# Patient Record
Sex: Male | Born: 1980 | Hispanic: No | Marital: Single | Smoking: Former smoker
Health system: Southern US, Community
[De-identification: ages and names within clinical notes are randomized; demographics above are authoritative.]

## PROBLEM LIST (undated history)

## (undated) DIAGNOSIS — K219 Gastro-esophageal reflux disease without esophagitis: Secondary | ICD-10-CM

---

## 2005-01-19 ENCOUNTER — Emergency Department (HOSPITAL_COMMUNITY): Admission: EM | Admit: 2005-01-19 | Discharge: 2005-01-19 | Payer: Self-pay | Admitting: Emergency Medicine

## 2005-01-22 ENCOUNTER — Inpatient Hospital Stay (HOSPITAL_COMMUNITY): Admission: RE | Admit: 2005-01-22 | Discharge: 2005-01-22 | Payer: Self-pay | Admitting: Otolaryngology

## 2006-03-11 ENCOUNTER — Emergency Department (HOSPITAL_COMMUNITY): Admission: EM | Admit: 2006-03-11 | Discharge: 2006-03-11 | Payer: Self-pay | Admitting: Family Medicine

## 2006-03-25 ENCOUNTER — Emergency Department (HOSPITAL_COMMUNITY): Admission: EM | Admit: 2006-03-25 | Discharge: 2006-03-25 | Payer: Self-pay | Admitting: Family Medicine

## 2006-03-27 ENCOUNTER — Emergency Department (HOSPITAL_COMMUNITY): Admission: EM | Admit: 2006-03-27 | Discharge: 2006-03-27 | Payer: Self-pay | Admitting: Emergency Medicine

## 2006-04-02 ENCOUNTER — Emergency Department (HOSPITAL_COMMUNITY): Admission: EM | Admit: 2006-04-02 | Discharge: 2006-04-02 | Payer: Self-pay | Admitting: Emergency Medicine

## 2006-04-09 ENCOUNTER — Emergency Department (HOSPITAL_COMMUNITY): Admission: EM | Admit: 2006-04-09 | Discharge: 2006-04-09 | Payer: Self-pay | Admitting: Emergency Medicine

## 2013-06-21 ENCOUNTER — Ambulatory Visit: Payer: Self-pay | Admitting: Family Medicine

## 2013-06-21 VITALS — BP 112/66 | HR 60 | Temp 98.3°F | Resp 16 | Ht 64.5 in | Wt 130.0 lb

## 2013-06-21 DIAGNOSIS — R1013 Epigastric pain: Secondary | ICD-10-CM

## 2013-06-21 DIAGNOSIS — IMO0001 Reserved for inherently not codable concepts without codable children: Secondary | ICD-10-CM

## 2013-06-21 DIAGNOSIS — G8929 Other chronic pain: Secondary | ICD-10-CM

## 2013-06-21 DIAGNOSIS — R6889 Other general symptoms and signs: Secondary | ICD-10-CM

## 2013-06-21 DIAGNOSIS — R300 Dysuria: Secondary | ICD-10-CM

## 2013-06-21 DIAGNOSIS — R350 Frequency of micturition: Secondary | ICD-10-CM

## 2013-06-21 LAB — POCT UA - MICROSCOPIC ONLY
Crystals, Ur, HPF, POC: NEGATIVE
Mucus, UA: NEGATIVE
Yeast, UA: NEGATIVE

## 2013-06-21 LAB — POCT URINALYSIS DIPSTICK
Bilirubin, UA: NEGATIVE
Glucose, UA: NEGATIVE
Leukocytes, UA: NEGATIVE
Nitrite, UA: NEGATIVE
Spec Grav, UA: 1.02
Urobilinogen, UA: 0.2
pH, UA: 5.5

## 2013-06-21 LAB — POCT CBC
Granulocyte percent: 60.3 %G (ref 37–80)
HCT, POC: 43.9 % (ref 43.5–53.7)
Hemoglobin: 14.1 g/dL (ref 14.1–18.1)
Lymph, poc: 1.9 (ref 0.6–3.4)
MCH, POC: 28.3 pg (ref 27–31.2)
MCHC: 32.1 g/dL (ref 31.8–35.4)
MCV: 88.1 fL (ref 80–97)
MID (cbc): 0.6 (ref 0–0.9)
MPV: 9.3 fL (ref 0–99.8)
POC Granulocyte: 3.8 (ref 2–6.9)
POC LYMPH PERCENT: 30.9 %L (ref 10–50)
POC MID %: 8.8 %M (ref 0–12)
Platelet Count, POC: 254 10*3/uL (ref 142–424)
RBC: 4.98 M/uL (ref 4.69–6.13)
RDW, POC: 13.3 %
WBC: 6.3 10*3/uL (ref 4.6–10.2)

## 2013-06-21 LAB — GLUCOSE, POCT (MANUAL RESULT ENTRY): POC Glucose: 80 mg/dl (ref 70–99)

## 2013-06-21 MED ORDER — RANITIDINE HCL 150 MG PO TABS: 150.0000 mg | ORAL_TABLET | Freq: Two times a day (BID) | ORAL | Status: DC

## 2013-06-21 NOTE — Progress Notes (Addendum)
Subjective:    Patient ID: Tyrone Mullen, male    DOB: 1980-05-12, 33 y.o.   MRN: [id number] This chart was scribed for Nilda Simmer, MD by Nicholos Johns, Medical Scribe. This patient's care was started at 9:13 PM.  06/21/2013  Chest Pain; Headache; Leg Pain; and Blurred Vision  Chest Pain  Associated symptoms include a cough, headaches and leg pain. Pertinent negatives include no abdominal pain, back pain, diaphoresis, dizziness, fever, nausea, numbness, palpitations, shortness of breath, vomiting or weakness.  Pertinent negatives for past medical history include no seizures.  Headache  Associated symptoms include coughing and rhinorrhea. Pertinent negatives include no abdominal pain, back pain, dizziness, ear pain, fever, nausea, numbness, seizures, sinus pressure, sore throat, tinnitus, vomiting or weakness.  Leg Pain  Pertinent negatives include no numbness.   HPI Comments: Tyrone Mullen is a 34 y.o. male who presents to the [facility] with multiple complaints. Describes myalgias, chest pain, and ear pain. Chest pain he describes as an intermittent stabbing. Right leg pain, onset 3 days ago; states it hurts to walk. Also reports frequency onset a couple of days ago. Went to use the bathroom 4x last night; says he doesn't normally get up to use the bathroom at all. Mild burning with urination but no discharge. Also reports head pressure, says it is not a HA. Pt states he had a cold, onset 1 weeks ago, in which he went to doctor and was prescribed ibuprofen which he has been taking. States he was told he would get better but has not been. Symptoms he noted were chills and sweats at night, possible fever, mild rhinorrhea, cough, and congestion. Pt is not currently sexually active; states he only has male sexual partners.   Has had jaw surgery in the past. Used to smoke but not anymore. Does not drink. Denies illicit drug use. Denies any other medical problems.   Denies  sore throat, nausea, vomiting, weight loss, diarrhea, pain in testicles, or cough.  Review of Systems  Constitutional: Negative for fever, chills, diaphoresis and fatigue.  HENT: Positive for congestion, postnasal drip and rhinorrhea. Negative for ear pain, sinus pressure, sore throat, tinnitus, trouble swallowing and voice change.   Respiratory: Positive for cough. Negative for shortness of breath, wheezing and stridor.   Cardiovascular: Positive for chest pain. Negative for palpitations and leg swelling.  Gastrointestinal: Negative for nausea, vomiting, abdominal pain, diarrhea, constipation, blood in stool, anal bleeding and rectal pain.  Genitourinary: Positive for dysuria, urgency and frequency. Negative for discharge, scrotal swelling and testicular pain.  Musculoskeletal: Positive for myalgias. Negative for back pain.  Skin: Negative for rash.  Neurological: Positive for headaches. Negative for dizziness, tremors, seizures, syncope, facial asymmetry, speech difficulty, weakness, light-headedness and numbness.   Past Medical History  Diagnosis Date  . GERD (gastroesophageal reflux disease)    No Known Allergies Current Outpatient Prescriptions  Medication Sig Dispense Refill  . ibuprofen (ADVIL,MOTRIN) 200 MG tablet Take 400 mg by mouth every 6 (six) hours as needed for moderate pain.    . promethazine (PHENERGAN) 25 MG tablet Take 1 tablet (25 mg total) by mouth every 6 (six) hours as needed for nausea or vomiting. 20 tablet 0   No current facility-administered medications for this visit.   Objective:    BP 112/66 mmHg  Pulse 60  Temp(Src) 98.3 F (36.8 C) (Oral)  Resp 16  Ht 5' 4.5" (1.638 m)  Wt 130 lb (58.968 kg)  BMI 21.98 kg/m2  SpO2 98% Physical Exam  Constitutional: He is oriented to person, place, and time. He appears well-developed and well-nourished. No distress.  HENT:  Head: Normocephalic and atraumatic.  Right Ear: External ear normal.  Left Ear: External  ear normal.  Nose: Nose normal.  Mouth/Throat: Oropharynx is clear and moist.  Scarring in right eardrum, no drainage.  Eyes: Conjunctivae and EOM are normal. Pupils are equal, round, and reactive to light.  Neck: Normal range of motion. Neck supple. Carotid bruit is not present. No thyromegaly present.  Cardiovascular: Normal rate, regular rhythm, normal heart sounds and intact distal pulses.  Exam reveals no gallop and no friction rub.   No murmur heard. Pulmonary/Chest: Effort normal and breath sounds normal. No respiratory distress. He has no wheezes. He has no rales.  Abdominal: Soft. Bowel sounds are normal. He exhibits no distension and no mass. There is tenderness. There is no rebound and no guarding.  Epigastric tenderness  Genitourinary:  Testicles without swelling or tenderness  Musculoskeletal: Normal range of motion.       Right hip: Normal. He exhibits normal range of motion, normal strength, no tenderness and no bony tenderness.       Right knee: Normal. He exhibits normal range of motion and no swelling. No tenderness found. No medial joint line, no lateral joint line, no MCL and no LCL tenderness noted.       Lumbar back: Normal. He exhibits normal range of motion, no tenderness, no bony tenderness, no pain and no spasm.  Lymphadenopathy:    He has no cervical adenopathy.  Neurological: He is alert and oriented to person, place, and time. No cranial nerve deficit.  Skin: Skin is warm and dry. No rash noted. He is not diaphoretic.  Psychiatric: He has a normal mood and affect. His behavior is normal.  Nursing note and vitals reviewed.  Results for orders placed or performed in visit on 06/21/13  Comprehensive metabolic panel  Result Value Ref Range   Sodium 140 135 - 145 mEq/L   Potassium 4.2 3.5 - 5.3 mEq/L   Chloride 104 96 - 112 mEq/L   CO2 25 19 - 32 mEq/L   Glucose, Bld 92 70 - 99 mg/dL   BUN 13 6 - 23 mg/dL   Creat 4.091.00 8.110.50 - 9.141.35 mg/dL   Total Bilirubin 0.5  0.2 - 1.2 mg/dL   Alkaline Phosphatase 51 39 - 117 U/L   AST 32 0 - 37 U/L   ALT 34 0 - 53 U/L   Total Protein 8.1 6.0 - 8.3 g/dL   Albumin 4.8 3.5 - 5.2 g/dL   Calcium 9.8 8.4 - 78.210.5 mg/dL  POCT CBC  Result Value Ref Range   WBC 6.3 4.6 - 10.2 K/uL   Lymph, poc 1.9 0.6 - 3.4   POC LYMPH PERCENT 30.9 10 - 50 %L   MID (cbc) 0.6 0 - 0.9   POC MID % 8.8 0 - 12 %M   POC Granulocyte 3.8 2 - 6.9   Granulocyte percent 60.3 37 - 80 %G   RBC 4.98 4.69 - 6.13 M/uL   Hemoglobin 14.1 14.1 - 18.1 g/dL   HCT, POC 95.643.9 21.343.5 - 53.7 %   MCV 88.1 80 - 97 fL   MCH, POC 28.3 27 - 31.2 pg   MCHC 32.1 31.8 - 35.4 g/dL   RDW, POC 08.613.3 %   Platelet Count, POC 254 142 - 424 K/uL   MPV 9.3 0 - 99.8 fL  POCT glucose (manual entry)  Result Value Ref Range   POC Glucose 80 70 - 99 mg/dl  POCT UA - Microscopic Only  Result Value Ref Range   WBC, Ur, HPF, POC 1-3    RBC, urine, microscopic 0-2    Bacteria, U Microscopic trace    Mucus, UA neg    Epithelial cells, urine per micros 2-4    Crystals, Ur, HPF, POC neg    Casts, Ur, LPF, POC granular    Yeast, UA neg   POCT urinalysis dipstick  Result Value Ref Range   Color, UA yellow    Clarity, UA clear    Glucose, UA neg    Bilirubin, UA neg    Ketones, UA trace    Spec Grav, UA 1.020    Blood, UA trace    pH, UA 5.5    Protein, UA trace    Urobilinogen, UA 0.2    Nitrite, UA neg    Leukocytes, UA Negative        Assessment & Plan:  Abdominal pain, chronic, epigastric - Plan: POCT CBC, POCT glucose (manual entry), POCT UA - Microscopic Only, POCT urinalysis dipstick, Comprehensive metabolic panel  Urinary frequency - Plan: POCT CBC, POCT glucose (manual entry), POCT UA - Microscopic Only, POCT urinalysis dipstick, Comprehensive metabolic panel  Dysuria - Plan: POCT CBC, POCT glucose (manual entry), POCT UA - Microscopic Only, POCT urinalysis dipstick, Comprehensive metabolic panel  Flu-like symptoms  Myalgia and myositis   1.  Abdominal pain epigastric: New.  Obtain labs; recommend BRAT diet; recommend Zantac 150mg  bid or Prilosec OTC daily for one month. If no improvement in one month, recommend abdominal US.  rx for Zantac provided. 2.  Urinary frequency and dysuria: New. Obtain labs, urine culture.   3.  Flu like symptoms: New.  Recommend supportive care with rest, fluids, ibuprofen. 4. Myalgias: New. Likely related to acute illness; recommend Tylenol or Ibuprofen PRN.  Benign exam in office. RTC if persists once recovers from acute illness.  Meds ordered this encounter  Medications  . DISCONTD: ranitidine (ZANTAC) 150 MG tablet    Sig: Take 1 tablet (150 mg total) by mouth 2 (two) times daily.    Dispense:  30 tablet    Refill:  0    No Follow-up on file.  I personally performed the services described in this documentation, which was scribed in my presence.  The recorded information has been reviewed and is accurate.  Nilda Simmer, M.D.  [facility] [address] [place], [place]  [address] [phone] phone [phone] fax

## 2013-06-21 NOTE — Patient Instructions (Signed)
1.  RECOMMEND TYLENOL FOR PAIN, HEADACHE, FEVER. 2.  STOP IBUPROFEN.

## 2013-06-22 LAB — COMPREHENSIVE METABOLIC PANEL
ALT: 34 U/L (ref 0–53)
AST: 32 U/L (ref 0–37)
Albumin: 4.8 g/dL (ref 3.5–5.2)
Alkaline Phosphatase: 51 U/L (ref 39–117)
BUN: 13 mg/dL (ref 6–23)
CO2: 25 mEq/L (ref 19–32)
Calcium: 9.8 mg/dL (ref 8.4–10.5)
Chloride: 104 mEq/L (ref 96–112)
Creat: 1 mg/dL (ref 0.50–1.35)
Glucose, Bld: 92 mg/dL (ref 70–99)
Potassium: 4.2 mEq/L (ref 3.5–5.3)
Sodium: 140 mEq/L (ref 135–145)
Total Bilirubin: 0.5 mg/dL (ref 0.2–1.2)
Total Protein: 8.1 g/dL (ref 6.0–8.3)

## 2013-06-25 ENCOUNTER — Telehealth: Payer: Self-pay

## 2013-06-25 NOTE — Telephone Encounter (Signed)
Advised pt to RTC for evaluation. States he will be in tomorrow morning.

## 2013-06-25 NOTE — Telephone Encounter (Signed)
Pt states that he is having pain in his stomach to the point where he cannot sleep at night, pt would like to speak with someone regarding this issue. Best# [phone]

## 2013-06-26 ENCOUNTER — Ambulatory Visit: Payer: Self-pay

## 2013-06-26 ENCOUNTER — Ambulatory Visit: Payer: Self-pay | Admitting: Family Medicine

## 2013-06-26 VITALS — BP 104/68 | HR 58 | Temp 97.7°F | Resp 16 | Ht 64.0 in | Wt 126.6 lb

## 2013-06-26 DIAGNOSIS — R109 Unspecified abdominal pain: Secondary | ICD-10-CM

## 2013-06-26 DIAGNOSIS — R1013 Epigastric pain: Secondary | ICD-10-CM

## 2013-06-26 LAB — POCT URINALYSIS DIPSTICK
Bilirubin, UA: NEGATIVE
Blood, UA: NEGATIVE
Glucose, UA: NEGATIVE
Ketones, UA: NEGATIVE
Leukocytes, UA: NEGATIVE
Nitrite, UA: NEGATIVE
Protein, UA: NEGATIVE
Spec Grav, UA: >=1.03
Urobilinogen, UA: 0.2
pH, UA: 5.5

## 2013-06-26 NOTE — Patient Instructions (Signed)
1.  Increase water to 6 bottles daily. 2.  Increase fruit and vegetables. 3.  Recommend taking multivitamin or Flinstone vitamin.   4. Return for worsening abdominal pain, vomiting, diarrhea.

## 2013-06-26 NOTE — Progress Notes (Signed)
Subjective:    Patient ID: Tyrone Mullen, male    DOB: 07/13/1980, 33 y.o.   MRN: [id number]  HPI Chief Complaint  Patient presents with  . Follow-up    abdominal pain, is not feeling better    This chart was scribed for Ethelda ChickKristi M Kalaya Infantino, MD by Andrew Auaven Small, ED Scribe. This patient was seen in room 11 and the patient's care was started at 9:28 AM.  HPI Comments: Tyrone Mullen is a 33 y.o. male who presents to the [facility] here for a five day follow up regarding constant abdominal pain. Pt reports that the abdominal pain has worsened. He reports that the pain worsens with stretching. He reports that eating has no effect to pain. Pt reports that he has a BM every other day. He states that he stills has chills, tingling to head, minor rhinorrhea and congestion. Pt reports that when breathing cold air he feels the tingling to head but denies it being a HA. Pt reports that he has SOB with deep breaths.  He denies fever, cough, sore throat, dysuria, blood in urine, nausea, emesis and diarrhea.   Pt was seen 5 days ago by Dr. Katrinka BlazingSmith for fever, CP, myalgias, abdominal pain and ear pain by Katrinka BlazingSmith. Pt was diagnosed with the flu and was prescribed Zantac. He reports that pt is still taking Zantac but reports no relief to symptoms.    History reviewed. No pertinent past medical history. No Known Allergies Prior to Admission medications   Medication Sig Start Date End Date Taking? Authorizing Provider  ranitidine (ZANTAC) 150 MG tablet Take 1 tablet (150 mg total) by mouth 2 (two) times daily. 06/21/13   Ethelda ChickKristi M Kirke Breach, MD   History   Social History  . Marital Status: Single    Spouse Name: N/A    Number of Children: N/A  . Years of Education: N/A   Occupational History  . Not on file.   Social History Main Topics  . Smoking status: Former Games developermoker  . Smokeless tobacco: Not on file  . Alcohol Use: No  . Drug Use: No  . Sexual Activity: Not on file   Other Topics Concern  .  Not on file   Social History Narrative  . No narrative on file     Review of Systems  Constitutional: Positive for chills. Negative for fever.  HENT: Positive for congestion and rhinorrhea. Negative for sore throat.   Respiratory: Positive for shortness of breath. Negative for cough.   Gastrointestinal: Positive for abdominal pain. Negative for nausea, vomiting, diarrhea, constipation and blood in stool.  Genitourinary: Negative for dysuria and difficulty urinating.  Neurological: Negative for headaches.       Objective:   Physical Exam  Nursing note and vitals reviewed. Constitutional: He is oriented to person, place, and time. He appears well-developed and well-nourished. No distress.  HENT:  Head: Normocephalic and atraumatic.  Mouth/Throat: Oropharynx is clear and moist. No oropharyngeal exudate.  Bilateral TM with scarring   Eyes: Conjunctivae and EOM are normal. Pupils are equal, round, and reactive to light.  Neck: Neck supple. No thyromegaly present.  Cardiovascular: Normal rate, regular rhythm and normal heart sounds.  Exam reveals no gallop and no friction rub.   No murmur heard. Pulmonary/Chest: Effort normal and breath sounds normal. No respiratory distress. He has no wheezes. He has no rales. He exhibits no tenderness.  Abdominal: Soft. Bowel sounds are normal. He exhibits no distension and no mass.  There is no rebound and no guarding. No hernia.  Non tender to RLQ, LLQ  Tender to palpitation superior to umbilicus  Pain in abdomen with extension  Musculoskeletal: Normal range of motion.  Lymphadenopathy:    He has no cervical adenopathy.  Neurological: He is alert and oriented to person, place, and time. No cranial nerve deficit. He exhibits normal muscle tone. Coordination normal.  Skin: Skin is warm and dry. He is not diaphoretic.  Psychiatric: He has a normal mood and affect. His behavior is normal.  .    Results for orders placed in visit on 06/26/13  POCT  URINALYSIS DIPSTICK      Result Value Ref Range   Color, UA yellow     Clarity, UA clear     Glucose, UA neg     Bilirubin, UA neg     Ketones, UA neg     Spec Grav, UA >=1.030     Blood, UA neg     pH, UA 5.5     Protein, UA neg     Urobilinogen, UA 0.2     Nitrite, UA neg     Leukocytes, UA Negative     [facility] reading (PRIMARY) by  Dr. Katrinka Blazing.  KUB: NAD; moderate stool burden.   Assessment & Plan:  Abdominal pain - Plan: POCT urinalysis dipstick, DG Abd 1 View  Abdominal pain, epigastric   1.  Abdominal pain epigastric:  Persistent; no associated n/v/d; moderate stool burden on KUB; recommend increased fluid intake and increased fiber intake; negative u/a in office.  No associated fever.  BRAT diet, hydration.  RTC for acute worsening.    I personally performed the services described in this documentation, which was scribed in my presence.  The recorded information has been reviewed and is accurate.   Nilda Simmer, M.D.  [facility] [address] [place], [place]  [address] [phone] phone [phone] fax

## 2013-07-05 ENCOUNTER — Other Ambulatory Visit: Payer: Self-pay | Admitting: Family Medicine

## 2013-07-12 ENCOUNTER — Ambulatory Visit: Payer: Self-pay | Admitting: Family Medicine

## 2013-07-12 VITALS — BP 118/70 | HR 68 | Temp 98.0°F | Resp 16 | Ht 64.0 in | Wt 125.0 lb

## 2013-07-12 DIAGNOSIS — K029 Dental caries, unspecified: Secondary | ICD-10-CM

## 2013-07-12 DIAGNOSIS — M791 Myalgia, unspecified site: Secondary | ICD-10-CM

## 2013-07-12 DIAGNOSIS — IMO0001 Reserved for inherently not codable concepts without codable children: Secondary | ICD-10-CM

## 2013-07-12 MED ORDER — PREDNISONE 20 MG PO TABS: ORAL_TABLET | ORAL | Status: DC

## 2013-07-12 MED ORDER — AMOXICILLIN 875 MG PO TABS: 875.0000 mg | ORAL_TABLET | Freq: Two times a day (BID) | ORAL | Status: DC

## 2013-07-12 NOTE — Progress Notes (Signed)
33 yo unemployed man with recurrent body pains, this being the third episode.  The first time it occurred was about a month ago.  He was tested and nothing was revealed.  The symptoms are generalized and are characterized by sharp pains in chest, joints, muscles.  Associated with nausea, headache, and dry irritated eyes  No foreign countries travel.  Originally from [place]  Objective: Thin healthy athletic-appearing man in no acute distress HEENT: Unremarkable except for dental caries Chest: Clear to auscultation Heart: Regular, no murmur or gallop, no rub Abdomen: Soft nontender without HSM Skin: Unremarkable with no suspicious lesions or rash. Extremities: No evidence for arthritis  Assessment: Patient has recurring myalgia which seems consistent with an arthritic syndrome. The dry eyes as well suggests something like Sjogren's syndrome or soft tissue inflammation.  Plan: Dental caries - Plan: amoxicillin (AMOXIL) 875 MG tablet  Myalgia - Plan: predniSONE (DELTASONE) 20 MG tablet  Signed, Elvina SidleKurt Allaina Brotzman, MD

## 2013-07-16 ENCOUNTER — Other Ambulatory Visit: Payer: Self-pay | Admitting: Family Medicine

## 2013-07-23 ENCOUNTER — Other Ambulatory Visit: Payer: Self-pay | Admitting: Family Medicine

## 2013-07-25 ENCOUNTER — Ambulatory Visit: Payer: Self-pay | Admitting: Family Medicine

## 2013-07-25 VITALS — BP 112/72 | HR 82 | Temp 98.1°F | Resp 16 | Ht 63.5 in | Wt 127.0 lb

## 2013-07-25 DIAGNOSIS — K219 Gastro-esophageal reflux disease without esophagitis: Secondary | ICD-10-CM

## 2013-07-25 MED ORDER — OMEPRAZOLE 20 MG PO CPDR: 20.0000 mg | DELAYED_RELEASE_CAPSULE | Freq: Every day | ORAL | Status: DC

## 2013-07-25 NOTE — Progress Notes (Signed)
This chart was scribed for Elvina SidleKurt Lauenstein, MD, by Yevette EdwardsAngela Bracken, ED Scribe. This patient's care was started at 5:59 PM.   Patient ID: Tyrone Mullen, male   DOB: 11/21/1980, 33 y.o.   MRN: [id number]      Patient Name: Tyrone Mullen Date of Birth: 02/06/1981 Medical Record Number: [id number] Gender: male Date of Encounter: 07/25/2013  Chief Complaint: Follow-up   History of Present Illness:  Tyrone Mullen is a 33 y.o. very pleasant male patient who presents with the following: continued pressure which radiates from his substernal chest to his umbilicus and wraps around to his back.  The pressure has persisted for approximately a month. The pt reports nausea associated with the discomfort. He also reports difficulty urinating  He reports improvement to dental pain. He took the antibiotic as prescribed.   The pt is unemployed. He states he cannot work because "he needs to get better."   There are no active problems to display for this patient.  History reviewed. No pertinent past medical history. History reviewed. No pertinent past surgical history. History  Substance Use Topics   Smoking status: Former Smoker   Smokeless tobacco: Not on file   Alcohol Use: No   Family History  Problem Relation Age of Onset   Hypertension Mother    No Known Allergies  Medication list has been reviewed and updated.  Current Outpatient Prescriptions on File Prior to Visit  Medication Sig Dispense Refill   amoxicillin (AMOXIL) 875 MG tablet Take 1 tablet (875 mg total) by mouth 2 (two) times daily.  20 tablet  0   predniSONE (DELTASONE) 20 MG tablet 2 daily with food  10 tablet  1   No current facility-administered medications on file prior to visit.    Review of Systems:  Positive: chest pressure; abdominal pressure; nausea; difficulty urinating  Physical Examination: Filed Vitals:   07/25/13 1755  BP: 112/72  Pulse: 82  Temp: 98.1 F (36.7 C)  Resp: 16   @vitals2 @ Body mass  index is 22.14 kg/(m^2). Ideal Body Weight: @FLOWAMB ([id number])@([id number])@  Minimal tenderness to abdomen. No HSM.  No masses. No guarding or rebound. Chest is clear.  Heart is regular with no murmur.  Skin shows no rashes or unusual lesions.   EKG / Labs / Xrays: None available at time of encounter  Assessment and Plan:  Will prescribe pt medication for acid reflux  Yevette EdwardsAngela Bracken, ED Scribe GERD (gastroesophageal reflux disease) - Plan: Ambulatory referral to Gastroenterology  Signed, Elvina SidleKurt Lauenstein, MD

## 2013-07-26 ENCOUNTER — Encounter: Payer: Self-pay | Admitting: Gastroenterology

## 2013-07-27 ENCOUNTER — Telehealth: Payer: Self-pay

## 2013-07-27 NOTE — Telephone Encounter (Signed)
PT STATES HE WAS SEEN BY THE DR AND ISN'T GETTING ANY BETTER, IN FACT HE IS HAVING HEADACHES NOW AND THINK IT MAY BE FROM THE MEDICATION PLEASE CALL [phone]

## 2013-07-28 ENCOUNTER — Ambulatory Visit: Payer: Self-pay

## 2013-07-28 ENCOUNTER — Ambulatory Visit: Payer: Self-pay | Admitting: Family Medicine

## 2013-07-28 VITALS — BP 100/60 | HR 69 | Temp 97.6°F | Resp 16 | Ht 63.5 in | Wt 127.0 lb

## 2013-07-28 DIAGNOSIS — K219 Gastro-esophageal reflux disease without esophagitis: Secondary | ICD-10-CM

## 2013-07-28 DIAGNOSIS — R1013 Epigastric pain: Secondary | ICD-10-CM

## 2013-07-28 DIAGNOSIS — M79669 Pain in unspecified lower leg: Secondary | ICD-10-CM

## 2013-07-28 DIAGNOSIS — M79609 Pain in unspecified limb: Secondary | ICD-10-CM

## 2013-07-28 NOTE — Patient Instructions (Signed)
Gastroesophageal Reflux Disease, Adult  Gastroesophageal reflux disease (GERD) happens when acid from your stomach flows up into the esophagus. When acid comes in contact with the esophagus, the acid causes soreness (inflammation) in the esophagus. Over time, GERD may create small holes (ulcers) in the lining of the esophagus.  CAUSES   · Increased body weight. This puts pressure on the stomach, making acid rise from the stomach into the esophagus.  · Smoking. This increases acid production in the stomach.  · Drinking alcohol. This causes decreased pressure in the lower esophageal sphincter (valve or ring of muscle between the esophagus and stomach), allowing acid from the stomach into the esophagus.  · Late evening meals and a full stomach. This increases pressure and acid production in the stomach.  · A malformed lower esophageal sphincter.  Sometimes, no cause is found.  SYMPTOMS   · Burning pain in the lower part of the mid-chest behind the breastbone and in the mid-stomach area. This may occur twice a week or more often.  · Trouble swallowing.  · Sore throat.  · Dry cough.  · Asthma-like symptoms including chest tightness, shortness of breath, or wheezing.  DIAGNOSIS   Your caregiver may be able to diagnose GERD based on your symptoms. In some cases, X-rays and other tests may be done to check for complications or to check the condition of your stomach and esophagus.  TREATMENT   Your caregiver may recommend over-the-counter or prescription medicines to help decrease acid production. Ask your caregiver before starting or adding any new medicines.   HOME CARE INSTRUCTIONS   · Change the factors that you can control. Ask your caregiver for guidance concerning weight loss, quitting smoking, and alcohol consumption.  · Avoid foods and drinks that make your symptoms worse, such as:  · Caffeine or alcoholic drinks.  · Chocolate.  · Peppermint or mint flavorings.  · Garlic and onions.  · Spicy foods.  · Citrus fruits,  such as oranges, lemons, or limes.  · Tomato-based foods such as sauce, chili, salsa, and pizza.  · Fried and fatty foods.  · Avoid lying down for the 3 hours prior to your bedtime or prior to taking a nap.  · Eat small, frequent meals instead of large meals.  · Wear loose-fitting clothing. Do not wear anything tight around your waist that causes pressure on your stomach.  · Raise the head of your bed 6 to 8 inches with wood blocks to help you sleep. Extra pillows will not help.  · Only take over-the-counter or prescription medicines for pain, discomfort, or fever as directed by your caregiver.  · Do not take aspirin, ibuprofen, or other nonsteroidal anti-inflammatory drugs (NSAIDs).  SEEK IMMEDIATE MEDICAL CARE IF:   · You have pain in your arms, neck, jaw, teeth, or back.  · Your pain increases or changes in intensity or duration.  · You develop nausea, vomiting, or sweating (diaphoresis).  · You develop shortness of breath, or you faint.  · Your vomit is green, yellow, black, or looks like coffee grounds or blood.  · Your stool is red, bloody, or black.  These symptoms could be signs of other problems, such as heart disease, gastric bleeding, or esophageal bleeding.  MAKE SURE YOU:   · Understand these instructions.  · Will watch your condition.  · Will get help right away if you are not doing well or get worse.  Document Released: 01/09/2005 Document Revised: 06/24/2011 Document Reviewed: 10/19/2010  ExitCare® Patient   that it causes damage to the esophagus, it is called gastroesophageal reflux disease (GERD). Nutrition therapy can help ease the discomfort of GERD. FOODS OR DRINKS TO AVOID OR  LIMIT  Smoking or chewing tobacco. Nicotine is one of the most potent stimulants to acid production in the gastrointestinal tract.  Caffeinated and decaffeinated coffee and black tea.  Regular or low-calorie carbonated beverages or energy drinks (caffeine-free carbonated beverages are allowed).   Strong spices, such as black pepper, white pepper, red pepper, cayenne, curry powder, and chili powder.  Peppermint or spearmint.  Chocolate.  High-fat foods, including meats and fried foods. Extra added fats including oils, butter, salad dressings, and nuts. Limit these to less than 8 tsp per day.  Fruits and vegetables if they are not tolerated, such as citrus fruits or tomatoes.  Alcohol.  Any food that seems to aggravate your condition. If you have questions regarding your diet, call your caregiver or a registered dietitian. OTHER THINGS THAT MAY HELP GERD INCLUDE:   Eating your meals slowly, in a relaxed setting.  Eating 5 to 6 small meals per day instead of 3 large meals.  Eliminating food for a period of time if it causes distress.  Not lying down until 3 hours after eating a meal.  Keeping the head of your bed raised 6 to 9 inches (15 to 23 cm) by using a foam wedge or blocks under the legs of the bed. Lying flat may make symptoms worse.  Being physically active. Weight loss may be helpful in reducing reflux in overweight or obese adults.  Wear loose fitting clothing EXAMPLE MEAL PLAN This meal plan is approximately 2,000 calories based on https://www.bernard.org/ meal planning guidelines. Breakfast   cup cooked oatmeal.  1 cup strawberries.  1 cup low-fat milk.  1 oz almonds. Snack  1 cup cucumber slices.  6 oz yogurt (made from low-fat or fat-free milk). Lunch  2 slice whole-wheat bread.  2 oz sliced [place].  2 tsp mayonnaise.  1 cup blueberries.  1 cup snap peas. Snack  6 whole-wheat crackers.  1 oz string cheese. Dinner   cup brown rice.  1  cup mixed veggies.  1 tsp olive oil.  3 oz grilled fish. Document Released: 04/01/2005 Document Revised: 06/24/2011 Document Reviewed: 02/15/2011 [facility] Patient Information 2014 [place], [place].   RESOURCE GUIDE  Chronic Pain Problems:  Contact [facility] [phone]  Patients need to be referred by their primary care doctor.  Insufficient Money for Medicine:  Contact United Way: call [phone]  No Primary Care Doctor:  Call Health Connect [phone] - can help you locate a primary care doctor that accepts your insurance, provides certain services, etc.  Physician Referral Service- [phone] Agencies that provide inexpensive medical care:  [facility] [phone]  [facility] Internal Medicine [phone]  [facility] [phone]  [facility] [phone]  Planned Parenthood [phone]  [facility] Child Clinic [phone] Medicaid-accepting [facility] Providers:  [facility]- [address], [address] [phone], Mon-Fri 9am-7pm, Sat 9am-1pm  [facility]- [address] [place], Suite [place] [phone]  [facility]- [address], Suite [place] [phone]  [facility] - [facility] Family [facility]- [address] [phone]  Renaye Rakers- [address] [place], [address], [phone] Only accepts [place] patients after they have their name applied to their card  Self Pay (no insurance) in [facility]:  Sickle Cell Patients - [facility] [phone]  [address], [phone]  [facility]- [address] [phone]  Patrcia Dolly- [facility] [place]- [address]  - [facility]- see information above (Speak to CitigroupPam H if you do not have insurance)  - [facility] [place]- [address], [phone]  - [facility]- [address] [place] [address], [phone]  - Dr Julio Sickssei-Bonsu- [address], [address], [place], [phone]  -  [facility] - [address], [phone]  - [facility] [place]- [address] [place] [address], [phone], also [address], [phone]  - [facility]- [address] [place], [phone], 1st & 3rd Saturday  every month, 10am-1pm  - Community Health and [facility]: [facility]  [address], [place].  Phone: [phone], Fax: [phone]. Hours of Operation: 9 am - 6 pm, M-F.  - [facility]  [address], [address],   Phone: [phone], Fax: [phone]. Hours of Operation: 8:30 am - 5:30 pm, M-F.  [facility]  [address]  [place], [place] [address]  [phone]  [facility]  [address]  [place], [place] [address]  [phone]  1) Find a Doctor and Pay Out of Pocket  Although you won't have to find out who is covered by your insurance plan, it is a good idea to ask around and get recommendations. You will then need to call the office and see if the doctor you have chosen will accept you as a new patient and what types of options they offer for patients who are self-pay. Some doctors offer discounts or will set up payment plans for their patients who do not have insurance, but you will need to ask so you aren't surprised when you get to your appointment.  2) Contact Your Local Health Department  Not all health departments have doctors that can see patients for sick visits, but many do, so it is worth a call to see if yours does. If you don't know where your local health department is, you can check in your phone book. The CDC also has a tool to help you locate your state's health department, and many state websites also have listings of all of their local health departments.  3) Find a Walk-in Clinic  If your illness is not likely to be very severe or complicated, you may want to try a walk in clinic. These are popping up all over the country in pharmacies, drugstores, and shopping centers.  They're usually staffed by nurse practitioners or physician assistants that have been trained to treat common illnesses and complaints. They're usually fairly quick and inexpensive. However, if you have serious medical issues or chronic medical problems, these are probably not your best option  STD Testing  [facility] of [facility] [place], STD Clinic, [address], [place], phone [phone] or [phone]. Monday - Friday, call for an appointment.  [facility] of [facility] [place], STD Clinic, [address], [place], phone [phone] or [phone]. Monday - Friday, call for an appointment. Abuse/Neglect:  [facility] Child Abuse Hotline [phone]  [facility] Child Abuse Hotline [phone] (After Hours) Emergency Shelter: [facility] [phone]  Maternity Homes:  Room at the [place] [facility] [phone]  Rebeca AlertFlorence [facility]ittenton Services [phone] MRSA Hotline #: 513 140 2496709 [phone]  Dental Assistance  If unable to pay or  uninsured, contact: [facility]. to become qualified for the adult dental clinic.  Patients with Medicaid: [facility]  [phone], [phone]  [address], [phone]  If unable to pay, or uninsured, contact [facility] [phone] in [place], [phone] in [facility]) to become qualified for the adult dental clinic  [facility]  [address]  [place], [place] [address]  [phone]  [email or web address]  Other ProofreaderLow-Cost Community Dental Services:  [facility]- [address], [place], [place], [address], [phone], Ext. 123, 2nd and 4th Thursday of the month at 6:30am. 10 clients each day by appointment, can sometimes see walk-in patients if someone does not show for an appointment.  [facility]- [address], [place], [place], [address], [phone]  [facility]- [address], [place], [place], [address], [phone]  [facility]- [phone]  [facility]- [phone]  [facility] Health [phone]- [phone] Behavioral Health Resources in the [facility]  Intensive Outpatient Programs:  [facility]  [address]  [place], [place]  [phone]  Both a day and evening program  [facility] Outpatient  [address]  [place], [place] [address]  [phone]  ADS: Alcohol & Drug Svcs  [address]  [place]  [phone]  [facility]  ACCESS LINE: [phone] or [phone]  [address]  [place], [place] [address]  EntrepreneurLoan.co.[email or web address]/adult.htm  Substance Abuse Resources:  Alcohol and Drug Services [phone]  [facility] [phone]  [facility] [phone]  [facility]oydene FlockDaymark [phone]) [phone]  Residential & Outpatient Substance Abuse Program [phone] Psychological Services:  [facility] [phone]  [facility] [phone]  [facility], [phone] [place]. [address], [place], ACCESS LINE: [phone] or [phone], EntrepreneurLoan.co.zaHttp://www.[email or web address]/services/adult.htm Mobile Crisis Teams:  Therapeutic Alternatives  Mobile Crisis Care Unit  [phone]  Assertive  Psychotherapeutic Services  [address] [place]  [phone]  Interventionist  [address]  [address]  [place]  [phone]  Self-Help/Support Groups:  Mental Health Assoc. of The [place] Variety of support groups  [phone] (call for more info)  Narcotics Anonymous (NA)  Caring Services  [address]  [place] - 2 meetings at this location  Residential Treatment Programs:  [facility] Residential Treatment  [address]  [place]  [phone]  [facility]  [address], [place] 287867107118  [place], [place] [address]  [phone]  [facility]  Residential Treatment Facility  [address]  [place], [place] [address]  [phone]  Admissions: 8am-3pm M-F  [facility]  [address]  [place], [place] [address]  [phone]  [facility]  [address]  [place], [place]  [phone]  [facility]  [address]  [place], [place]  [phone]  [facility] - Intensive Outpatient  [address]  [place], [place]  [phone]  [facility] (Addiction Recovery Care Assoc.)  [address]  [place], [place]  [phone] or [phone]  Residential Treatment Services (RTS), Medicaid  [address]  [place], [place]  [phone]  Fellowship [address]  [address]  [place]  [phone]  [facility] Resources:  [phone]  General Therapy  Angie FavaJulie Brannon, PhD  [address]  [place], [place] [address]  [phone]  Insurance  [facility]rc[facility] DollyM[facility]al  [phone]  [address]  [place], [place] [address]  [phone]  [facility] Recovery  [address] [place], [place] [address]  [phone]  Insurance/Medicaid/sponsorship through [facility] and Families  [address]  [place], [place] [address]  Therapy/tele-psych/case  [phone]  [facility]  [address], [place] [address]  Adolescent/group home/case management  [phone]  Creola Corn PhD  General therapy  Insurance  [phone]  Dr. Lolly Mustache, [place], M-F  [phone]  [facility] [place] United Way [facility] Dept.  [address] [place] [address]  Blondell Reveal  Phone: [phone] Phone: [phone] Phone: [phone]  [facility], [phone]  [facility] - [facility] [place]- [phone] - [facility] in [place], [address]  [phone], Insurance  [place] Child Abuse  Hotline  [phone] or [phone] (After Hours)

## 2013-07-28 NOTE — Progress Notes (Signed)
Chief Complaint:  Chief Complaint  Patient presents with  . Dysuria    white stuff coming out  . Abdominal Pain  . possible allergic reaction to prilosec    HPI: Tyrone Mullen is a 33 y.o. male who is here for: 1. midepigastric pain, similar sxs since March . Was prilosec and did not help. He took it a couple of times and states he may have had mouth swelling so he stopped. He also during this whole time ahs ahd dental caries issues but since is unemployed cannot see a Education officer, communitydentist. He was put on Amoxacillin for his dental caries. He has taken zantac and Prilosec without releif. He has an upcoming GI appt. His GI sxs are similar it is diffuse pain in substernal area and going to his esopahagus and stomach to his navel. It is associated with nausea. He denies any other GI sxs such as emesis, diarrhea/constipation. He denies eating GERD inducing foods.  2. He has been complaining of diffuse  myaglias with dry eyes, and also numbness and tingling intermittently  and Dr Tyrone Mullen 2 office visits ago put him on prednisone, he stated that did not help. He has shin pain of his right lower leg. NKI. He used to play a lot of sports competitively. He denies any prior sxs until 2 months ago. He deneis being bitten by an ticks/insects.  3. He has had pain with urination since February, He denies any rashes, pelvic pain, urethral discharge, swollen glands.   He is from [place] he is a poor historian, has many complaints but has no insurance and wants only limited testing. He is scheduled to see GI in 1 week but wants an earlier appt. .   History reviewed. No pertinent past medical history. History reviewed. No pertinent past surgical history. History   Social History  . Marital Status: Single    Spouse Name: N/A    Number of Children: N/A  . Years of Education: N/A   Social History Main Topics  . Smoking status: Former Games developermoker  . Smokeless tobacco: None  . Alcohol Use: No  . Drug Use: No  .  Sexual Activity: None   Other Topics Concern  . None   Social History Narrative  . None   Family History  Problem Relation Age of Onset  . Hypertension Mother    No Known Allergies Prior to Admission medications   Medication Sig Start Date End Date Taking? Authorizing Provider  amoxicillin (AMOXIL) 875 MG tablet Take 1 tablet (875 mg total) by mouth 2 (two) times daily. 07/12/13   Tyrone SidleKurt Lauenstein, MD  omeprazole (PRILOSEC) 20 MG capsule Take 1 capsule (20 mg total) by mouth daily. 07/25/13   Tyrone SidleKurt Lauenstein, MD  predniSONE (DELTASONE) 20 MG tablet 2 daily with food 07/12/13   Tyrone SidleKurt Lauenstein, MD     ROS: The patient denies fevers, chills, night sweats, unintentional weight loss, chest pain, palpitations, wheezing, dyspnea on exertion, , vomiting,  dysuria, hematuria, melena  All other systems have been reviewed and were otherwise negative with the exception of those mentioned in the HPI and as above.    PHYSICAL EXAM: Filed Vitals:   07/28/13 1319  BP: 100/60  Pulse: 69  Temp: 97.6 F (36.4 C)  Resp: 16   Filed Vitals:   07/28/13 1319  Height: 5' 3.5" (1.613 m)  Weight: 127 lb (57.607 kg)   Body mass index is 22.14 kg/(m^2).  General: Alert, no acute distress, anxious, thin  HEENT:  Normocephalic, atraumatic, oropharynx patent. EOMI, PERRLA, TM nl, no exudates Cardiovascular:  Regular rate and rhythm, no rubs murmurs or gallops.  No Carotid bruits, radial pulse intact. No pedal edema.  Respiratory: Clear to auscultation bilaterally.  No wheezes, rales, or rhonchi.  No cyanosis, no use of accessory musculature GI: No organomegaly, abdomen is soft and non-tender, positive bowel sounds.  No masses. Skin: No rashes. Neurologic: Facial musculature symmetric. Psychiatric: Patient is appropriate throughout our interaction. Lymphatic: No cervical lymphadenopathy Musculoskeletal: Gait intact. 5/5 UE and LE, 2/2 DTRs UE and LE, full ROM Right shin tenderness 5/5 strength, neg  straight leg + DP    LABS: Results for orders placed in visit on 06/26/13  POCT URINALYSIS DIPSTICK      Result Value Ref Range   Color, UA yellow     Clarity, UA clear     Glucose, UA neg     Bilirubin, UA neg     Ketones, UA neg     Spec Grav, UA >=1.030     Blood, UA neg     pH, UA 5.5     Protein, UA neg     Urobilinogen, UA 0.2     Nitrite, UA neg     Leukocytes, UA Negative       EKG/XRAY:   [facility]. Normal chest and abdomen  ASSESSMENT/PLAN: Encounter Diagnoses  Name Primary?  . GERD (gastroesophageal reflux disease) Yes  . Abdominal pain, epigastric   . Pain in shin    This is a very anxious [name] who has been having chronic abd pain that sound like GERD. He has not been compliant with treatment, he has been having similar sxs but has tried zantac and then prilosec and when things do not work  he stops, he also has been on NSAID which he has stopped taking after he was told he had GERD, he does not think he has GERD.  He was also prescribed prednisone for his diffuse myalgias in the mist of all of this. The prednisone did not help with his mylagias, and I am not sure if it contributed to his abd pain sxs. He also has had dysuria. His basic blood work and UA have been normal since the 4 visits he has been here for these similar sxs. He is scheduled to see GI .  Acute abd xray today was negative, H pylori IgM pending Continue with prilosec 20 mg BID [facility]er to [facility] for [facility]D testing since self pay [facility] This information has been given to patient and he understands. I told him I will try to get an earlier GI appt but it is unlikely I can make it earlier than the GI appt he currently has in 1 week.  GERD precautions given F/u prn  Gross sideeffects, risk and benefits, and alternatives of medications d/w patient. Patient is aware that all medications have potential sideeffects and we are unable to predict  every sideeffect or drug-drug interaction that may occur.  Lenell Antuhao P Le, DO 07/28/2013 3:30 PM

## 2013-07-28 NOTE — Telephone Encounter (Signed)
Pt is going to come in to the office. States the chest pain is not as bad but still present. He now has a severe headache. Advised to RTC. Pt is on his way.

## 2013-07-29 LAB — HELICOBACTER PYLORI  ANTIBODY, IGM: Helicobacter pylori, IgM: 0 U/mL (ref <9.0)

## 2013-08-18 ENCOUNTER — Encounter (HOSPITAL_COMMUNITY): Payer: Self-pay | Admitting: Emergency Medicine

## 2013-08-18 ENCOUNTER — Emergency Department (HOSPITAL_COMMUNITY)
Admission: EM | Admit: 2013-08-18 | Discharge: 2013-08-19 | Disposition: A | Discharge status: Home / Self Care | Payer: Self-pay | Attending: Emergency Medicine | Admitting: Emergency Medicine

## 2013-08-18 DIAGNOSIS — Z8719 Personal history of other diseases of the digestive system: Secondary | ICD-10-CM | POA: Insufficient documentation

## 2013-08-18 DIAGNOSIS — R209 Unspecified disturbances of skin sensation: Secondary | ICD-10-CM | POA: Insufficient documentation

## 2013-08-18 DIAGNOSIS — M79669 Pain in unspecified lower leg: Secondary | ICD-10-CM

## 2013-08-18 DIAGNOSIS — R110 Nausea: Secondary | ICD-10-CM | POA: Insufficient documentation

## 2013-08-18 DIAGNOSIS — R202 Paresthesia of skin: Secondary | ICD-10-CM

## 2013-08-18 DIAGNOSIS — M79609 Pain in unspecified limb: Secondary | ICD-10-CM | POA: Insufficient documentation

## 2013-08-18 DIAGNOSIS — R079 Chest pain, unspecified: Secondary | ICD-10-CM | POA: Insufficient documentation

## 2013-08-18 DIAGNOSIS — Z87891 Personal history of nicotine dependence: Secondary | ICD-10-CM | POA: Insufficient documentation

## 2013-08-18 DIAGNOSIS — R5381 Other malaise: Secondary | ICD-10-CM | POA: Insufficient documentation

## 2013-08-18 DIAGNOSIS — R5383 Other fatigue: Secondary | ICD-10-CM

## 2013-08-18 DIAGNOSIS — F411 Generalized anxiety disorder: Secondary | ICD-10-CM | POA: Insufficient documentation

## 2013-08-18 HISTORY — DX: Gastro-esophageal reflux disease without esophagitis: K21.9

## 2013-08-18 LAB — URINALYSIS, ROUTINE W REFLEX MICROSCOPIC
Bilirubin Urine: NEGATIVE
Glucose, UA: NEGATIVE mg/dL
Hgb urine dipstick: NEGATIVE
Ketones, ur: NEGATIVE mg/dL
Leukocytes, UA: NEGATIVE
Nitrite: NEGATIVE
Protein, ur: NEGATIVE mg/dL
Specific Gravity, Urine: 1.002 — ABNORMAL LOW (ref 1.005–1.030)
Urobilinogen, UA: 0.2 mg/dL (ref 0.0–1.0)
pH: 6 (ref 5.0–8.0)

## 2013-08-18 LAB — CBC WITH DIFFERENTIAL/PLATELET
Basophils Absolute: 0 10*3/uL (ref 0.0–0.1)
Basophils Relative: 0 % (ref 0–1)
Eosinophils Absolute: 0.6 10*3/uL (ref 0.0–0.7)
Eosinophils Relative: 9 % — ABNORMAL HIGH (ref 0–5)
HCT: 43 % (ref 39.0–52.0)
Hemoglobin: 15.3 g/dL (ref 13.0–17.0)
Lymphocytes Relative: 36 % (ref 12–46)
Lymphs Abs: 2.4 10*3/uL (ref 0.7–4.0)
MCH: 29.2 pg (ref 26.0–34.0)
MCHC: 35.6 g/dL (ref 30.0–36.0)
MCV: 82.1 fL (ref 78.0–100.0)
Monocytes Absolute: 0.5 10*3/uL (ref 0.1–1.0)
Monocytes Relative: 8 % (ref 3–12)
Neutro Abs: 3.1 10*3/uL (ref 1.7–7.7)
Neutrophils Relative %: 47 % (ref 43–77)
Platelets: 273 10*3/uL (ref 150–400)
RBC: 5.24 MIL/uL (ref 4.22–5.81)
RDW: 12.3 % (ref 11.5–15.5)
WBC: 6.6 10*3/uL (ref 4.0–10.5)

## 2013-08-18 LAB — COMPREHENSIVE METABOLIC PANEL
ALT: 16 U/L (ref 0–53)
AST: 20 U/L (ref 0–37)
Albumin: 4.6 g/dL (ref 3.5–5.2)
Alkaline Phosphatase: 55 U/L (ref 39–117)
BUN: 11 mg/dL (ref 6–23)
CO2: 27 mEq/L (ref 19–32)
Calcium: 9.3 mg/dL (ref 8.4–10.5)
Chloride: 100 mEq/L (ref 96–112)
Creatinine, Ser: 1.08 mg/dL (ref 0.50–1.35)
GFR calc Af Amer: >90 mL/min (ref >90)
GFR calc non Af Amer: 89 mL/min — ABNORMAL LOW (ref >90)
Glucose, Bld: 84 mg/dL (ref 70–99)
Potassium: 4.2 mEq/L (ref 3.7–5.3)
Sodium: 138 mEq/L (ref 137–147)
Total Bilirubin: 0.4 mg/dL (ref 0.3–1.2)
Total Protein: 7.9 g/dL (ref 6.0–8.3)

## 2013-08-18 LAB — LIPASE, BLOOD: Lipase: 30 U/L (ref 11–59)

## 2013-08-18 NOTE — ED Notes (Signed)
Pt c/o mid abd pain, bilat feet pain, bilat hand pain, R shin pain, blurred vision, "wet stools" onset in February after eating pizza. Pt has been seen by PCP for same and requested today to be tested for Cancer. Pt is A & O and denies n/v, denies pain at this time.

## 2013-08-19 ENCOUNTER — Emergency Department (HOSPITAL_COMMUNITY): Payer: Self-pay

## 2013-08-19 MED ORDER — PROMETHAZINE HCL 25 MG PO TABS: 25.0000 mg | ORAL_TABLET | Freq: Four times a day (QID) | ORAL | Status: AC | PRN

## 2013-08-19 NOTE — Discharge Instructions (Signed)
Continue zantac or pepcid twice a day in case acid is the cause of your stomach pain.  Take promethazine as prescribed for nausea.   Follow up with the gastroenterologist as scheduled.  Follow up with the neurologist for further evaluation of numbness and tingling of scalp and arms/legs.  Follow up with orthopedic doctor if you don't have improvement in right leg pain w/ rest, ice and tylenol.  Avoid taking ibuprofen, advil, aleve and aspirin.

## 2013-08-19 NOTE — ED Provider Notes (Signed)
CSN: [id number]     Arrival date & time 08/18/13  2046 History   First MD Initiated Contact with Patient 08/18/13 2359     Chief Complaint  Patient presents with  . Abdominal Pain     (Consider location/radiation/quality/duration/timing/severity/associated sxs/prior Treatment) HPI History provided by pt and prior chart.  Pt presents w/ multiple complaints and he is a poor historian.  There is a slight communication barrier.  Pt has had intermittent pain in upper abdomen w/ associated nausea after eating x 3-4 months.  He feels this pain in his chest as well.  Evaluated for same at an urgent care, diagnosed w/ GERD, and 20mg  prilosec bid recommended.  Pt had a reaction to this medication and discontinued.  He had no relief w/ zantac.  No associated fever, vomiting, change in bowels, urinary sx.  Does not drink alcohol or take NSAIDs on a regular basis.  No abd pain currently.  Also c/o tingling and pressure on the top of his head, also for 3-4 months.  Notices when he extends his neck especially.  His eyes feel dry and he has occasional twitching of the right, but denies blurred vision and diplopia as well as dizziness.  Has generalized weakness and intermittent tingling in arms and legs.  Also has pain in R shin that is aggravated by bearing weight.  No associated skin changes.  No trauma.  No pertinent PMH.  Past Medical History  Diagnosis Date  . GERD (gastroesophageal reflux disease)    History reviewed. No pertinent past surgical history. Family History  Problem Relation Age of Onset  . Hypertension Mother    History  Substance Use Topics  . Smoking status: Former Games developer  . Smokeless tobacco: Not on file  . Alcohol Use: No    Review of Systems  All other systems reviewed and are negative.     Allergies  Review of patient's allergies indicates no known allergies.  Home Medications   Prior to Admission medications   Medication Sig Start Date End Date Taking? Authorizing  Provider  ibuprofen (ADVIL,MOTRIN) 200 MG tablet Take 400 mg by mouth every 6 (six) hours as needed for moderate pain.   Yes Historical Provider, MD   BP 122/67  Pulse 79  Temp(Src) 98.7 F (37.1 C) (Oral)  Resp 18  Ht 5\' 4"  (1.626 m)  Wt 130 lb (58.968 kg)  BMI 22.30 kg/m2 Physical Exam  Nursing note and vitals reviewed. Constitutional: He is oriented to person, place, and time. He appears well-developed and well-nourished. No distress.  HENT:  Head: Normocephalic and atraumatic.  No skin changes, edema or tenderness of scalp  Eyes:  Normal appearance  Neck: Normal range of motion.  Cardiovascular: Normal rate and regular rhythm.   Pulmonary/Chest: Effort normal and breath sounds normal. No respiratory distress.  Abdominal: Soft. Bowel sounds are normal. He exhibits no distension and no mass. There is no rebound and no guarding.  Epigastric and RUQ tenderness w/ negative murphys.  Genitourinary:  No CVA tenderness  Musculoskeletal: Normal range of motion.  Neurological: He is alert and oriented to person, place, and time.  CN 3-12 intact.  No sensory deficits.  5/5 and equal upper and lower extremity strength.  No past pointing.  No ataxia.  Skin: Skin is warm and dry. No rash noted.  Psychiatric: He has a normal mood and affect. His behavior is normal.    ED Course  Procedures (including critical care time) Labs Review Labs Reviewed  CBC WITH  DIFFERENTIAL - Abnormal; Notable for the following:    Eosinophils Relative 9 (*)    All other components within normal limits  COMPREHENSIVE METABOLIC PANEL - Abnormal; Notable for the following:    GFR calc non Af Amer 89 (*)    All other components within normal limits  URINALYSIS, ROUTINE W REFLEX MICROSCOPIC - Abnormal; Notable for the following:    Specific Gravity, Urine 1.002 (*)    All other components within normal limits  LIPASE, BLOOD    Imaging Review Dg Tibia/fibula Right  08/19/2013   CLINICAL DATA:  Anterior  tibia fibula pain for 2 months  EXAM: RIGHT TIBIA AND FIBULA - 2 VIEW  COMPARISON:  None.  FINDINGS: There is no evidence of fracture or other focal bone lesions. Soft tissues are unremarkable.  IMPRESSION: No acute osseous injury of the tibia or fibula.   Electronically Signed   By: Elige KoHetal  Patel   On: 08/19/2013 01:02   Ct Head Wo Contrast  08/19/2013   CLINICAL DATA:  Headache  EXAM: CT HEAD WITHOUT CONTRAST  TECHNIQUE: Contiguous axial images were obtained from the base of the skull through the vertex without intravenous contrast.  COMPARISON:  None.  FINDINGS: There is no acute intracranial hemorrhage or infarct. No mass lesion or midline shift. Gray-white matter differentiation is well maintained. Ventricles are normal in size without evidence of hydrocephalus. CSF containing spaces are within normal limits. No extra-axial fluid collection.  The calvarium is intact.  Orbital soft tissues are within normal limits.  The paranasal sinuses are clear. A chronic appearing left mastoid effusion noted.  Scalp soft tissues are unremarkable.  IMPRESSION: 1. Chronic left mastoid effusion. 2. Otherwise normal head CT with no acute intracranial abnormality identified.   Electronically Signed   By: Rise MuBenjamin  McClintock M.D.   On: 08/19/2013 01:11   [place] Abdomen Complete  08/19/2013   CLINICAL DATA:  Postprandial abdominal pain  EXAM: ULTRASOUND ABDOMEN COMPLETE  COMPARISON:  None.  FINDINGS: Gallbladder:  No gallstones or wall thickening visualized. No sonographic Murphy sign noted.  Common bile duct:  Diameter: 3.3 mm  Liver:  No focal lesion identified. Within normal limits in parenchymal echogenicity.  IVC:  No abnormality visualized.  Pancreas:  The pancreas is suboptimally evaluated secondary to overlying bowel gas.  Spleen:  Size and appearance within normal limits.  Right Kidney:  Length: 10 cm. Echogenicity within normal limits. No mass or hydronephrosis visualized.  Left Kidney:  Length: 9.9 cm. Echogenicity  within normal limits. No mass or hydronephrosis visualized.  Abdominal aorta:  No aneurysm visualized.  Other findings:  None.  IMPRESSION: 1. No cholelithiasis or sonographic evidence of acute cholecystitis.  2. Suboptimal visualization of the pancreas secondary to overlying bowel gas.   Electronically Signed   By: Elige KoHetal  Patel   On: 08/19/2013 01:35     EKG Interpretation None      MDM   Final diagnoses:  Paresthesias  Lower leg pain    33yo healthy M presents w/ multiple complaints.  He is a poor historian and appears anxious; mentions multiple times that he is afraid he has cancer. Has had paresthesias of scalp and all four extremities as well as sensation of dry eyes, generalized weakness and pressure on the top of his head x 3-4 months.  Second visit for same.  No known FH of MS or other neurologic disorder. Afebrile, well-appearing, no focal neuro deficits on exam.  CT head unremarkable.  Referred to neuro for further evaluation.  Also c/o post-prandial epigastric pain and nausea for same amt of time.  This is the second time this has been addressed as well; diagnosed w/ gastritis at urgent care but he has not had relief w/ zantac.  Asx currently.  Abd soft/non-distended, epigastric and RUQ ttp on exam.  Labs unremarkable and US abd neg for cholelithiasis.  He has GI f/u scheduled for next month.  Also c/o pain in right shin with bearing weight, that also started 3-4 months ago.  He believes it is related to what is going on in head and abd.  Xray ordered d/t duration of sx and is negative.  Recommended ice and rest.  He will take tylenol for pain and avoid NSAIDs d/t possiblity of gastritis.  All test results discussed with patient and he has been reassured.  Return precautions discussed.     Arie SabinaCatherine E Nehal Witting, PA-C 08/19/13 [phone]

## 2013-08-20 NOTE — ED Provider Notes (Signed)
Medical screening examination/treatment/procedure(s) were performed by non-physician practitioner and as supervising physician I was immediately available for consultation/collaboration.   EKG Interpretation None        Courtney F Horton, MD 08/20/13 0244 

## 2013-08-27 ENCOUNTER — Other Ambulatory Visit: Payer: Self-pay | Admitting: Gastroenterology

## 2013-08-27 DIAGNOSIS — R1310 Dysphagia, unspecified: Secondary | ICD-10-CM

## 2013-08-27 DIAGNOSIS — R1010 Upper abdominal pain, unspecified: Secondary | ICD-10-CM

## 2013-08-31 ENCOUNTER — Ambulatory Visit
Admission: RE | Admit: 2013-08-31 | Discharge: 2013-08-31 | Disposition: A | Discharge status: Home / Self Care | Payer: No Typology Code available for payment source | Source: Ambulatory Visit | Attending: Gastroenterology | Admitting: Gastroenterology

## 2013-08-31 ENCOUNTER — Other Ambulatory Visit: Payer: Self-pay

## 2013-08-31 ENCOUNTER — Encounter (INDEPENDENT_AMBULATORY_CARE_PROVIDER_SITE_OTHER): Payer: Self-pay

## 2013-08-31 DIAGNOSIS — R1310 Dysphagia, unspecified: Secondary | ICD-10-CM

## 2013-08-31 DIAGNOSIS — R1010 Upper abdominal pain, unspecified: Secondary | ICD-10-CM

## 2013-09-14 ENCOUNTER — Ambulatory Visit: Payer: Self-pay | Admitting: Gastroenterology

## 2016-03-07 IMAGING — CT CT HEAD W/O CM
2 series · 16 of 30 positions shown, 20 images · non-contrast
Comparison: None.

CLINICAL DATA: Headache

EXAM:
CT HEAD WITHOUT CONTRAST
TECHNIQUE: Contiguous axial images were obtained from the base of the skull
through the vertex without intravenous contrast.

[Series 2: head w/o · axial · non-contrast · 0.45mm/px · z∈[-115,+5]mm · 13 of 30 slices shown, 17 images]
[im 3/30  brain]
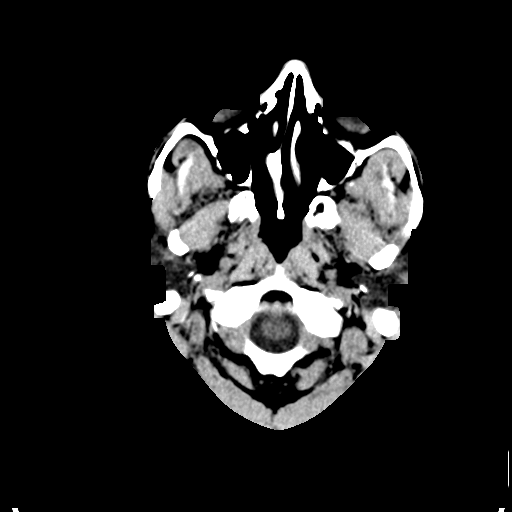
[im 3/30  bone]
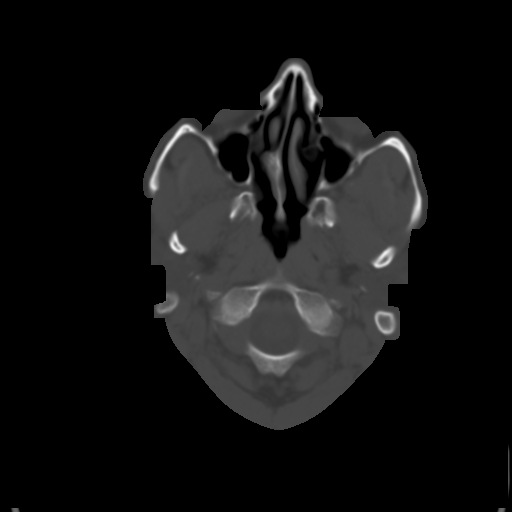
[im 5/30  brain]
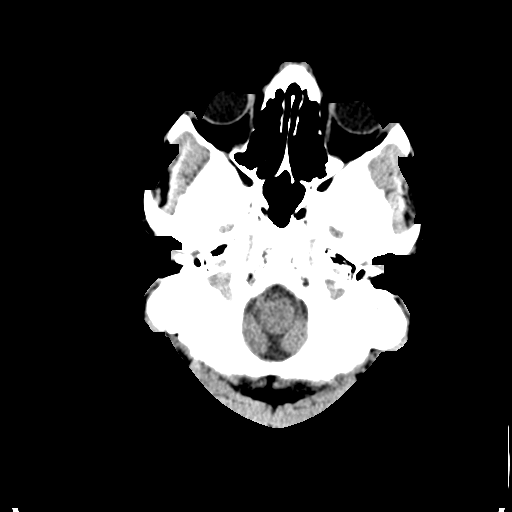
[im 7/30  brain]
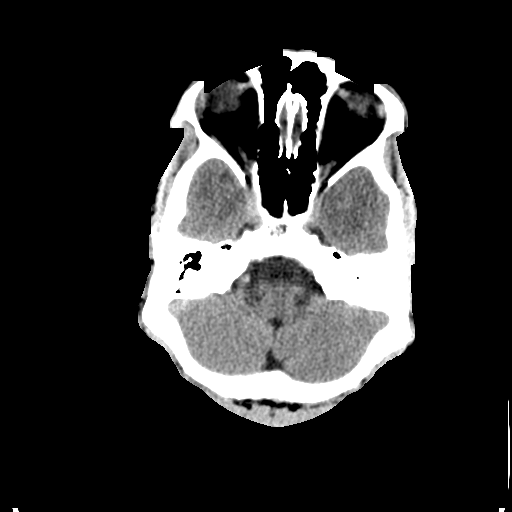
[im 9/30  brain]
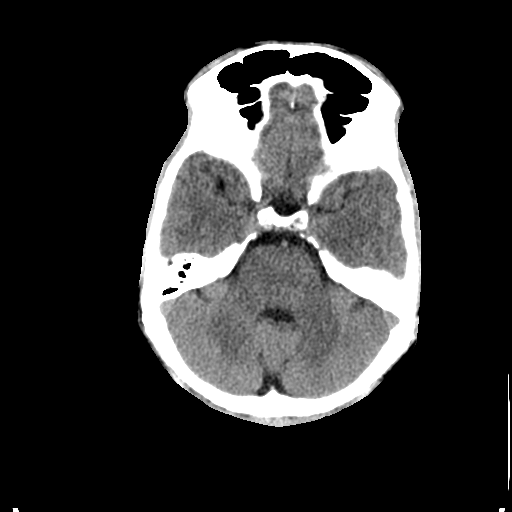
[im 11/30  brain]
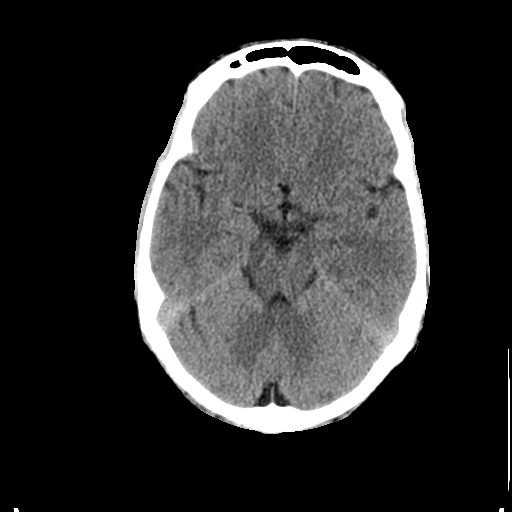
[im 11/30  bone]
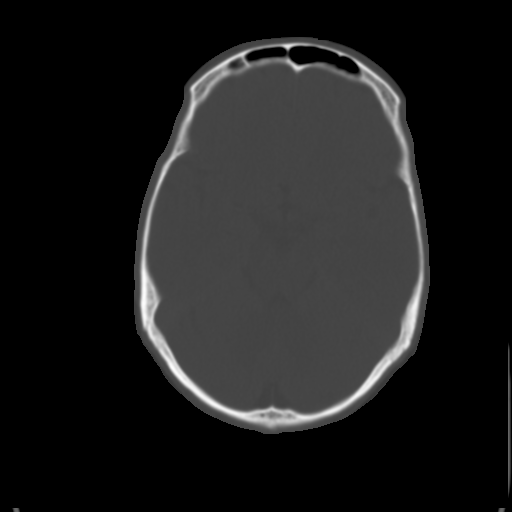
[im 13/30  brain]
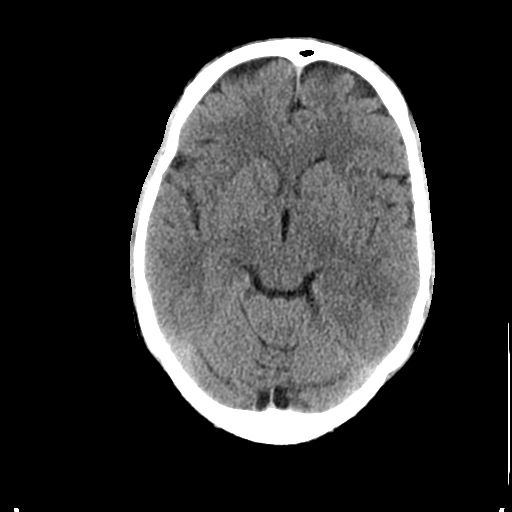
[im 15/30  brain]
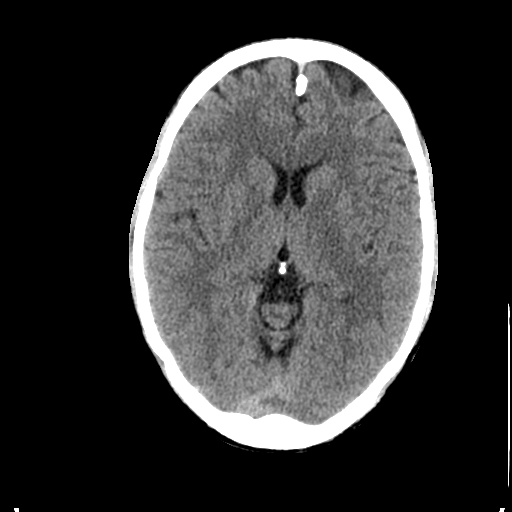
[im 17/30  brain]
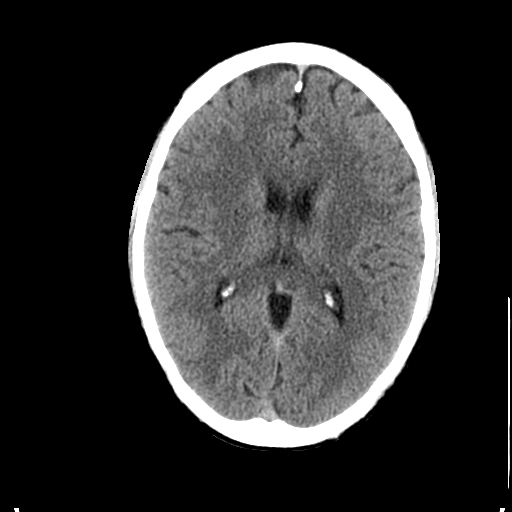
[im 19/30  brain]
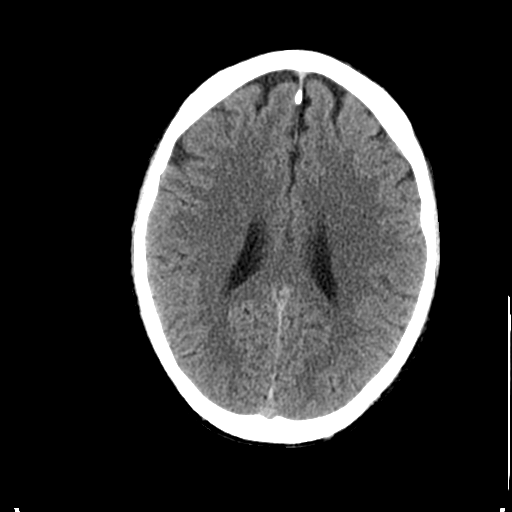
[im 19/30  bone]
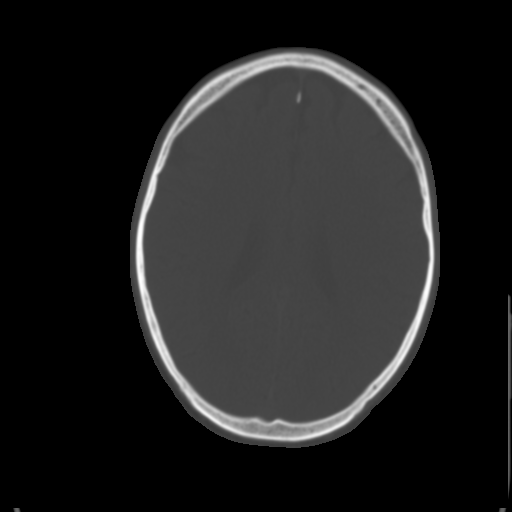
[im 21/30  brain]
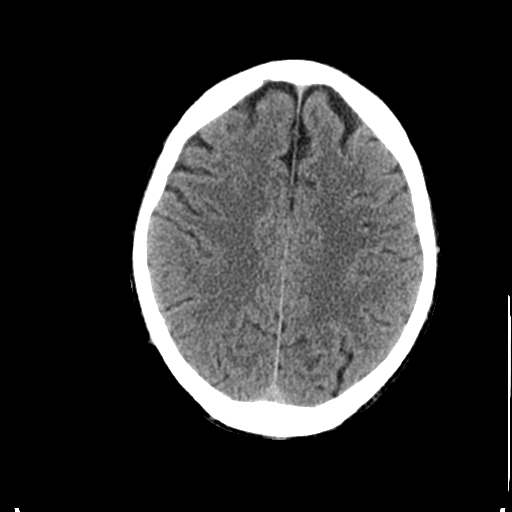
[im 23/30  brain]
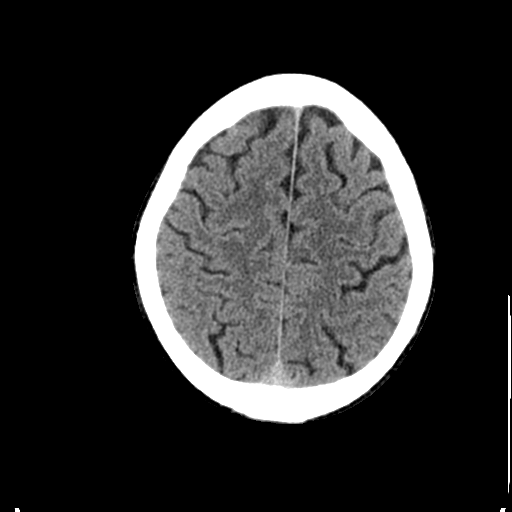
[im 25/30  brain]
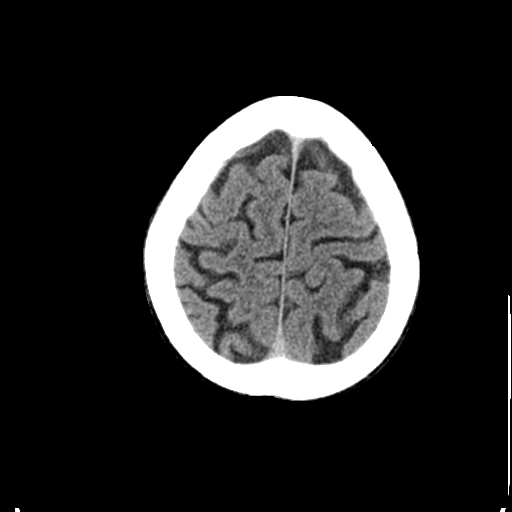
[im 27/30  brain]
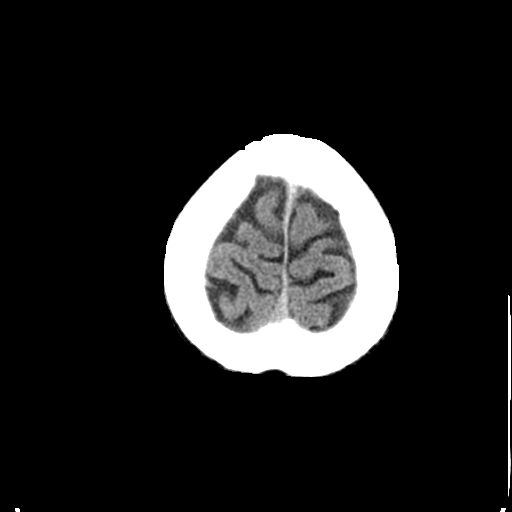
[im 27/30  bone]
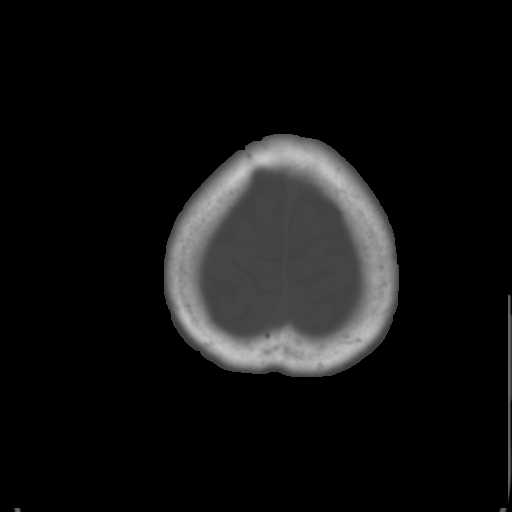

[Series 3: bone windows · axial · 0.45mm/px · z∈[-115,-75]mm · 3 of 30 slices shown]
[im 3/30  bone]
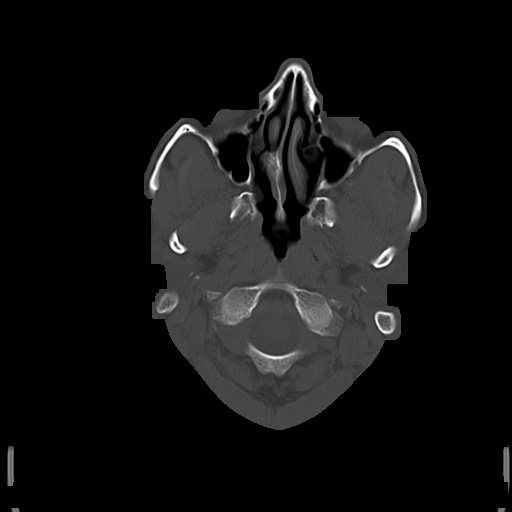
[im 7/30  bone]
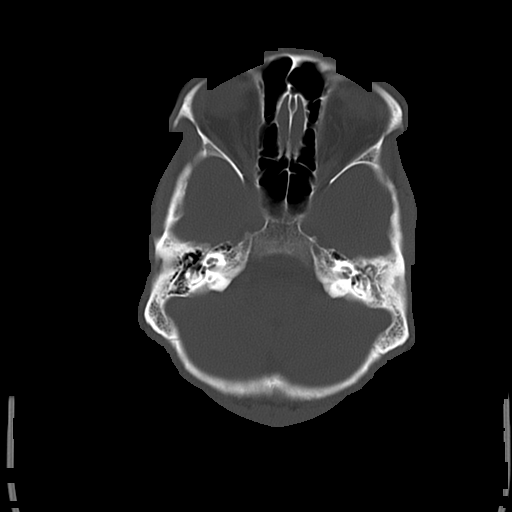
[im 11/30  bone]
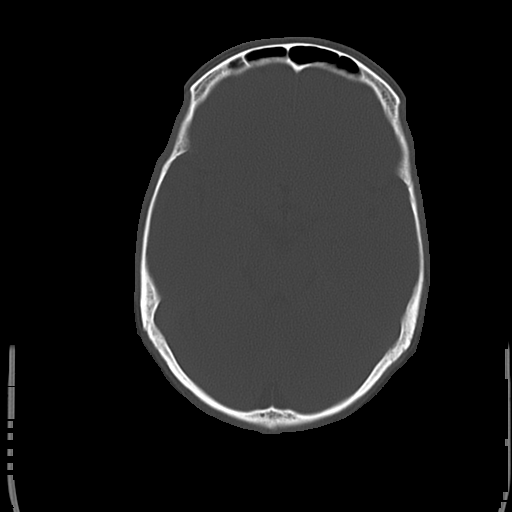

[16 of 30 positions shown; findings below may reference images not displayed]

FINDINGS: There is no acute intracranial hemorrhage or infarct. No mass lesion
or midline shift. Gray-white matter differentiation is well
maintained. Ventricles are normal in size without evidence of
hydrocephalus. CSF containing spaces are within normal limits. No
extra-axial fluid collection.

The calvarium is intact.

Orbital soft tissues are within normal limits.

The paranasal sinuses are clear. A chronic appearing left mastoid
effusion noted.

Scalp soft tissues are unremarkable.
IMPRESSION: 1. Chronic left mastoid effusion.
2. Otherwise normal head CT with no acute intracranial abnormality
identified.

## 2019-09-08 ENCOUNTER — Other Ambulatory Visit: Payer: Self-pay | Admitting: Gastroenterology

## 2019-09-08 DIAGNOSIS — R1011 Right upper quadrant pain: Secondary | ICD-10-CM

## 2019-09-16 ENCOUNTER — Other Ambulatory Visit: Payer: Self-pay

## 2019-09-27 ENCOUNTER — Ambulatory Visit
Admission: RE | Admit: 2019-09-27 | Discharge: 2019-09-27 | Disposition: A | Discharge status: Home / Self Care | Payer: 59 | Source: Ambulatory Visit | Attending: Gastroenterology | Admitting: Gastroenterology

## 2019-09-27 DIAGNOSIS — R1011 Right upper quadrant pain: Secondary | ICD-10-CM

## 2020-08-31 ENCOUNTER — Other Ambulatory Visit: Payer: Self-pay

## 2020-08-31 ENCOUNTER — Other Ambulatory Visit: Payer: Self-pay | Admitting: Gastroenterology

## 2020-08-31 ENCOUNTER — Ambulatory Visit
Admission: RE | Admit: 2020-08-31 | Discharge: 2020-08-31 | Disposition: A | Discharge status: Home / Self Care | Payer: 59 | Source: Ambulatory Visit | Attending: Gastroenterology | Admitting: Gastroenterology

## 2020-08-31 DIAGNOSIS — R1011 Right upper quadrant pain: Secondary | ICD-10-CM

## 2020-08-31 MED ORDER — IOPAMIDOL (ISOVUE-300) INJECTION 61%
100.0000 mL | Freq: Once | INTRAVENOUS | Status: AC | PRN
  Administered 2020-08-31: 100 mL via INTRAVENOUS

## 2020-09-06 ENCOUNTER — Other Ambulatory Visit: Payer: Self-pay | Admitting: Gastroenterology

## 2020-09-06 DIAGNOSIS — R112 Nausea with vomiting, unspecified: Secondary | ICD-10-CM

## 2020-09-06 DIAGNOSIS — R1011 Right upper quadrant pain: Secondary | ICD-10-CM

## 2020-09-07 ENCOUNTER — Encounter (HOSPITAL_COMMUNITY): Payer: Self-pay

## 2020-09-07 ENCOUNTER — Emergency Department (HOSPITAL_COMMUNITY)
Admission: EM | Admit: 2020-09-07 | Discharge: 2020-09-07 | Disposition: A | Discharge status: Home / Self Care | Payer: 59 | Attending: Emergency Medicine | Admitting: Emergency Medicine

## 2020-09-07 ENCOUNTER — Emergency Department (HOSPITAL_COMMUNITY): Payer: 59

## 2020-09-07 ENCOUNTER — Other Ambulatory Visit: Payer: Self-pay

## 2020-09-07 DIAGNOSIS — R079 Chest pain, unspecified: Secondary | ICD-10-CM | POA: Diagnosis not present

## 2020-09-07 DIAGNOSIS — R1110 Vomiting, unspecified: Secondary | ICD-10-CM | POA: Diagnosis not present

## 2020-09-07 DIAGNOSIS — R109 Unspecified abdominal pain: Secondary | ICD-10-CM | POA: Diagnosis not present

## 2020-09-07 DIAGNOSIS — Z87891 Personal history of nicotine dependence: Secondary | ICD-10-CM | POA: Diagnosis not present

## 2020-09-07 DIAGNOSIS — R059 Cough, unspecified: Secondary | ICD-10-CM | POA: Insufficient documentation

## 2020-09-07 DIAGNOSIS — K219 Gastro-esophageal reflux disease without esophagitis: Secondary | ICD-10-CM | POA: Insufficient documentation

## 2020-09-07 LAB — COMPREHENSIVE METABOLIC PANEL
ALT: 24 U/L (ref 0–44)
AST: 31 U/L (ref 15–41)
Albumin: 4.4 g/dL (ref 3.5–5.0)
Alkaline Phosphatase: 60 U/L (ref 38–126)
Anion gap: 6 (ref 5–15)
BUN: 12 mg/dL (ref 6–20)
CO2: 28 mmol/L (ref 22–32)
Calcium: 9.1 mg/dL (ref 8.9–10.3)
Chloride: 102 mmol/L (ref 98–111)
Creatinine, Ser: 0.83 mg/dL (ref 0.61–1.24)
GFR, Estimated: >60 mL/min (ref >60)
Glucose, Bld: 101 mg/dL — ABNORMAL HIGH (ref 70–99)
Potassium: 3.9 mmol/L (ref 3.5–5.1)
Sodium: 136 mmol/L (ref 135–145)
Total Bilirubin: 0.1 mg/dL — ABNORMAL LOW (ref 0.3–1.2)
Total Protein: 8.4 g/dL — ABNORMAL HIGH (ref 6.5–8.1)

## 2020-09-07 LAB — URINALYSIS, ROUTINE W REFLEX MICROSCOPIC
Bilirubin Urine: NEGATIVE
Glucose, UA: NEGATIVE mg/dL
Hgb urine dipstick: NEGATIVE
Ketones, ur: NEGATIVE mg/dL
Leukocytes,Ua: NEGATIVE
Nitrite: NEGATIVE
Protein, ur: NEGATIVE mg/dL
Specific Gravity, Urine: 1.006 (ref 1.005–1.030)
pH: 7 (ref 5.0–8.0)

## 2020-09-07 LAB — CBC
HCT: 44.6 % (ref 39.0–52.0)
Hemoglobin: 14.4 g/dL (ref 13.0–17.0)
MCH: 27.6 pg (ref 26.0–34.0)
MCHC: 32.3 g/dL (ref 30.0–36.0)
MCV: 85.6 fL (ref 80.0–100.0)
Platelets: 351 10*3/uL (ref 150–400)
RBC: 5.21 MIL/uL (ref 4.22–5.81)
RDW: 12.6 % (ref 11.5–15.5)
WBC: 5.5 10*3/uL (ref 4.0–10.5)
nRBC: 0 % (ref 0.0–0.2)

## 2020-09-07 LAB — EXPECTORATED SPUTUM ASSESSMENT W GRAM STAIN, RFLX TO RESP C: Special Requests: NORMAL

## 2020-09-07 LAB — LIPASE, BLOOD: Lipase: 43 U/L (ref 11–51)

## 2020-09-07 MED ORDER — ALBUTEROL SULFATE HFA 108 (90 BASE) MCG/ACT IN AERS
2.0000 | INHALATION_SPRAY | Freq: Once | RESPIRATORY_TRACT | Status: AC
  Administered 2020-09-07: 2 via RESPIRATORY_TRACT
  Filled 2020-09-07: qty 6.7

## 2020-09-07 MED ORDER — PROMETHAZINE-CODEINE 6.25-10 MG/5ML PO SYRP: 5.0000 mL | ORAL_SOLUTION | Freq: Four times a day (QID) | ORAL | 0 refills | Status: AC | PRN

## 2020-09-07 MED ORDER — HYDROCODONE-ACETAMINOPHEN 5-325 MG PO TABS
1.0000 | ORAL_TABLET | Freq: Once | ORAL | Status: AC
  Administered 2020-09-07: 1 via ORAL
  Filled 2020-09-07: qty 1

## 2020-09-07 MED ORDER — ONDANSETRON 4 MG PO TBDP
4.0000 mg | ORAL_TABLET | Freq: Once | ORAL | Status: AC | PRN
  Administered 2020-09-07: 4 mg via ORAL
  Filled 2020-09-07: qty 1

## 2020-09-07 MED ORDER — DEXAMETHASONE 4 MG PO TABS
10.0000 mg | ORAL_TABLET | Freq: Once | ORAL | Status: AC
  Administered 2020-09-07: 10 mg via ORAL
  Filled 2020-09-07: qty 2

## 2020-09-07 NOTE — Discharge Instructions (Addendum)
Use the inhaler if it provides cough relief. You can take the cough syrup prescribed every 4 hours as needed. It will cause drowsiness so do not take it and drive or try to work.   It is important to follow up with your primary care physician for recheck and any further outpatient evaluation needed for your ongoing cough. Cultures were also done on your sputum that will need to be reviewed with your doctor.   Return to the ED with any new or worsening symptoms.

## 2020-09-07 NOTE — ED Provider Notes (Signed)
[facility] Provider Note   CSN: [id number] Arrival date & time: 09/07/20  0019     History Chief Complaint  Patient presents with  . Abdominal Pain    Tyrone Mullen is a 40 y.o. male.  Patient to ED with complaint of pain in the right lateral chest/abdomen for several days. No fever. He reports significant cough that is worse with lying down, productive, that causes him to vomit, that has been going on for "a while". He is a nonsmoker, no history of asthma. He denies any nausea or vomiting that is not associated with cough. The pain in his side is exacerbated by cough. No diarrhea, constipation. No fever at any time. No congestion. He reports being seen 5/19 for evaluation and had a CT done that he reports only showed a problem in his lungs. He was started on Doxycycline at that time and has been taking it without improvement.   The history is provided by the patient. No language interpreter was used.  Abdominal Pain Associated symptoms: cough and vomiting (cough associated only)   Associated symptoms: no chills, no fever and no nausea        Past Medical History:  Diagnosis Date  . GERD (gastroesophageal reflux disease)     There are no problems to display for this patient.   History reviewed. No pertinent surgical history.     Family History  Problem Relation Age of Onset  . Hypertension Mother     Social History   Tobacco Use  . Smoking status: Former Smoker  Substance Use Topics  . Alcohol use: No  . Drug use: No    Home Medications Prior to Admission medications   Medication Sig Start Date End Date Taking? Authorizing Provider  ibuprofen (ADVIL,MOTRIN) 200 MG tablet Take 400 mg by mouth every 6 (six) hours as needed for moderate pain.    [provider]  promethazine (PHENERGAN) 25 MG tablet Take 1 tablet (25 mg total) by mouth every 6 (six) hours as needed for nausea or vomiting. 08/19/13   Schinlever, Santina Evans,  PA-C    Allergies    Patient has no known allergies.  Review of Systems   Review of Systems  Constitutional: Negative for chills and fever.  HENT: Negative.   Respiratory: Positive for cough.   Cardiovascular: Negative.   Gastrointestinal: Positive for abdominal pain and vomiting (cough associated only). Negative for nausea.  Genitourinary: Negative.   Musculoskeletal: Negative.  Negative for myalgias.  Skin: Negative.   Neurological: Negative.     Physical Exam Updated Vital Signs BP 129/82 (BP Location: Left Arm)   Pulse 73   Temp 98.1 F (36.7 C) (Oral)   Resp 15   Ht 5\' 4"  (1.626 m)   Wt 56.7 kg   SpO2 100%   BMI 21.46 kg/m   Physical Exam Vitals and nursing note reviewed.  Constitutional:      Appearance: He is well-developed.  HENT:     Head: Normocephalic.  Cardiovascular:     Rate and Rhythm: Normal rate and regular rhythm.  Pulmonary:     Effort: Pulmonary effort is normal.     Breath sounds: Normal breath sounds. No wheezing, rhonchi or rales.     Comments: Actively coughing. Cough is forceful, productive. No post-tussive emesis.  Abdominal:     General: Bowel sounds are normal.     Palpations: Abdomen is soft.     Tenderness: There is no abdominal tenderness. There is no  guarding or rebound.  Musculoskeletal:        General: Normal range of motion.     Cervical back: Normal range of motion and neck supple.  Skin:    General: Skin is warm and dry.     Findings: No rash.  Neurological:     Mental Status: He is alert and oriented to person, place, and time.     ED Results / Procedures / Treatments   Labs (all labs ordered are listed, but only abnormal results are displayed) Labs Reviewed  COMPREHENSIVE METABOLIC PANEL - Abnormal; Notable for the following components:      Result Value   Glucose, Bld 101 (*)    Total Protein 8.4 (*)    Total Bilirubin 0.1 (*)    All other components within normal limits  URINALYSIS, ROUTINE W REFLEX  MICROSCOPIC - Abnormal; Notable for the following components:   Color, Urine STRAW (*)    All other components within normal limits  LIPASE, BLOOD  CBC    EKG None  Radiology No results found.  Procedures Procedures   Medications Ordered in ED Medications  ondansetron (ZOFRAN-ODT) disintegrating tablet 4 mg (has no administration in time range)  HYDROcodone-acetaminophen (NORCO/VICODIN) 5-325 MG per tablet 1 tablet (has no administration in time range)  albuterol (VENTOLIN HFA) 108 (90 Base) MCG/ACT inhaler 2 puff (has no administration in time range)    ED Course  I have reviewed the triage vital signs and the nursing notes.  Pertinent labs & imaging results that were available during my care of the patient were reviewed by me and considered in my medical decision making (see chart for details).    MDM Rules/Calculators/A&P                          Patient to ED with c/o abdominal pain. On history and exam, the pain is felt to be chest wall, associated/exacerbated by cough. Vomiting is also only post-tussive. Abdominal exam is benign. Labs unremarkable.   Will give something for pain. Try Albuterol inhaler to see if this helps control cough. CXR pending.   CXR clear. Recheck: he reports maybe some improvement in cough with Albuterol.   Feel the patient's right sided pain is chest wall pain and vomiting are secondary to cough. Will send sputum for culture and will include AFB evaluation. Patient just returning from [place]. However, low suspicion with no fever, no night sweats, normal CXR. Results can be reviewed with primary care in follow up.  Decadron provided. Will provide Rx Tussionex for nighttime use.   Final Clinical Impression(s) / ED Diagnoses Final diagnoses:  None   1. Cough  Rx / DC Orders ED Discharge Orders    None       Elpidio Anis, PA-C 09/07/20 4709    Sabas Sous, MD 09/07/20 [phone]

## 2020-09-07 NOTE — ED Triage Notes (Signed)
N/V/abdominal pain in right upper quadrant since Monday. Went to PCP Tuesday and had CT scan that he reports was negative. Vomiting yellow bile since Monday. Placed on Bentyl, had it last today at 0010.

## 2020-09-08 LAB — ACID FAST SMEAR (AFB, MYCOBACTERIA): Acid Fast Smear: NEGATIVE

## 2020-09-09 LAB — CULTURE, RESPIRATORY W GRAM STAIN
Culture: NORMAL
Special Requests: NORMAL

## 2020-09-20 ENCOUNTER — Ambulatory Visit
Admission: RE | Admit: 2020-09-20 | Discharge: 2020-09-20 | Disposition: A | Discharge status: Home / Self Care | Payer: 59 | Source: Ambulatory Visit | Attending: Gastroenterology | Admitting: Gastroenterology

## 2020-09-20 DIAGNOSIS — R112 Nausea with vomiting, unspecified: Secondary | ICD-10-CM

## 2020-09-20 DIAGNOSIS — R1011 Right upper quadrant pain: Secondary | ICD-10-CM

## 2020-10-20 LAB — ACID FAST CULTURE WITH REFLEXED SENSITIVITIES (MYCOBACTERIA): Acid Fast Culture: NEGATIVE

## 2023-04-09 IMAGING — US US ABDOMEN LIMITED
1 series · 14 of 25 positions shown · non-contrast
Comparison: CT 08/31/2020, ultrasound 09/27/2019

CLINICAL DATA: Right upper quadrant pain with nausea and vomiting

EXAM:
ULTRASOUND ABDOMEN LIMITED RIGHT UPPER QUADRANT

[Series 1: us abdomen limited · 0.26mm/px · 14 of 55 slices shown]
[im 1/55]
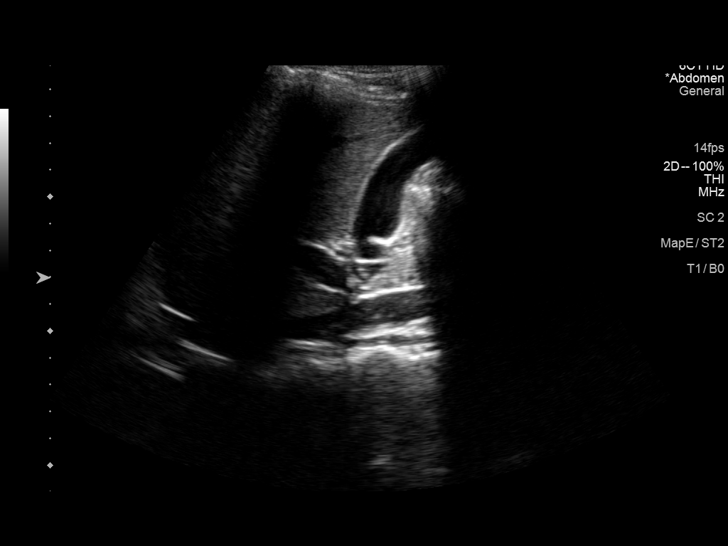
[im 5/55]
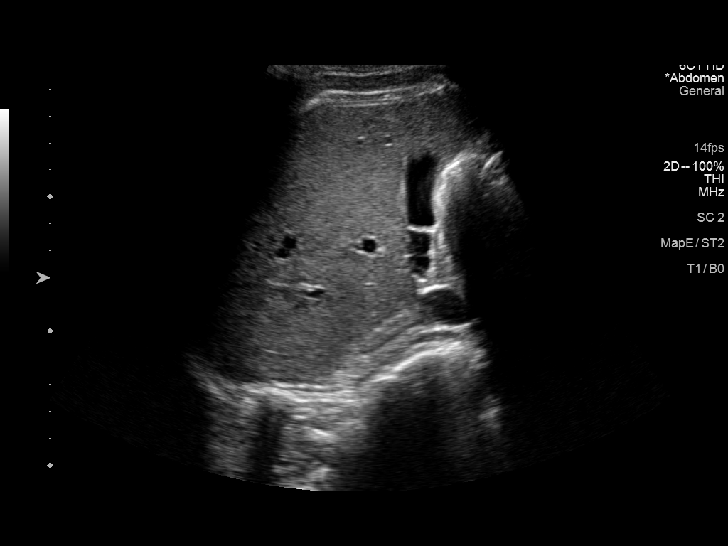
[im 10/55]
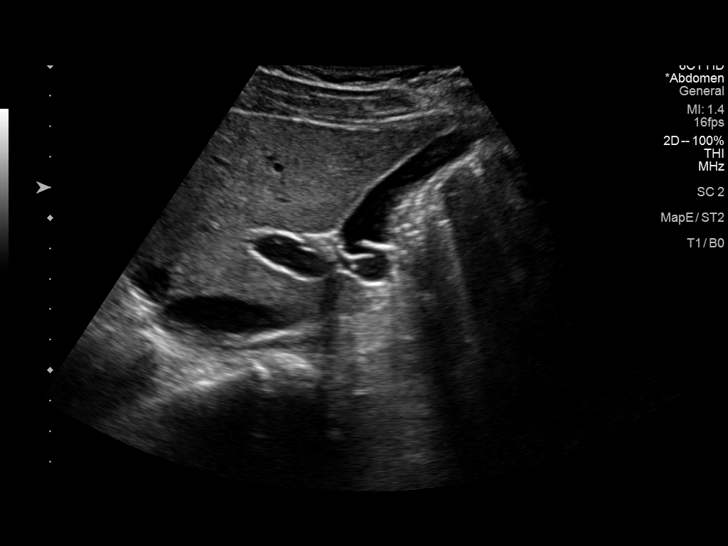
[im 14/55]
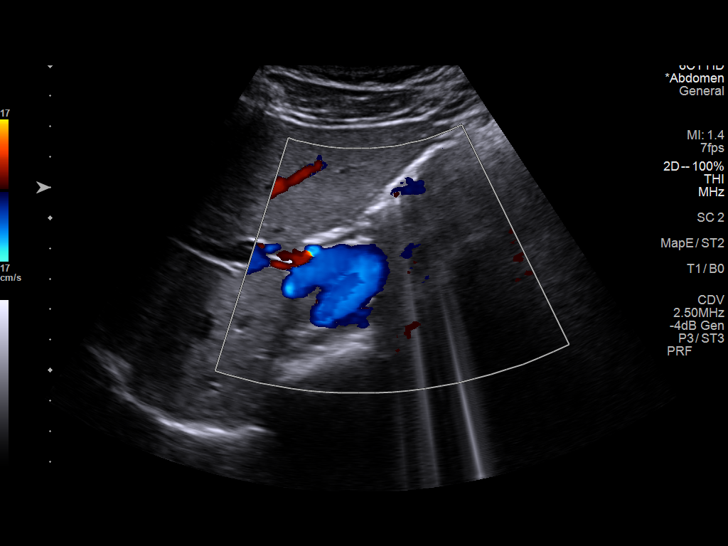
[im 19/55]
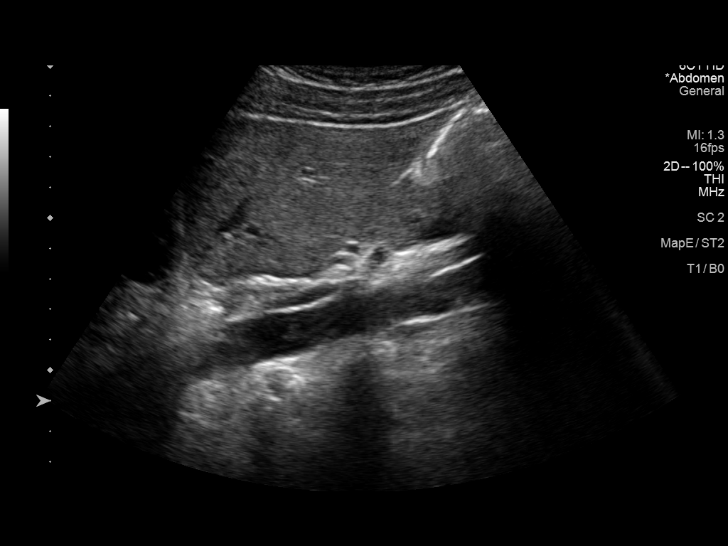
[im 21/55]
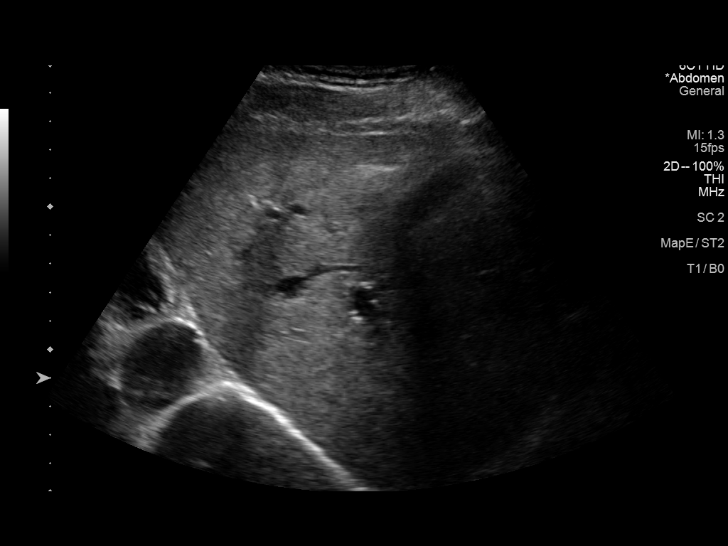
[im 25/55]
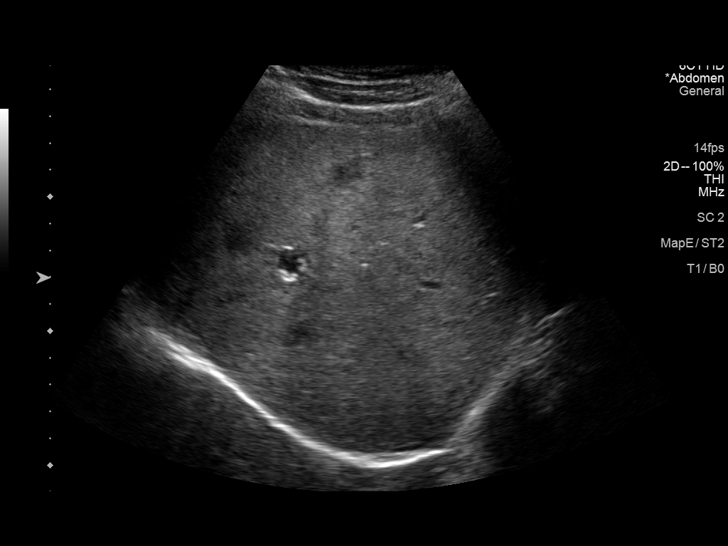
[im 30/55]
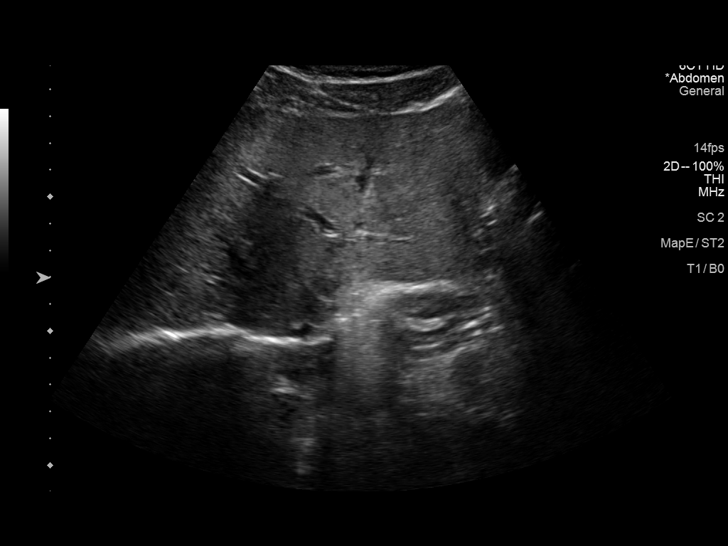
[im 34/55]
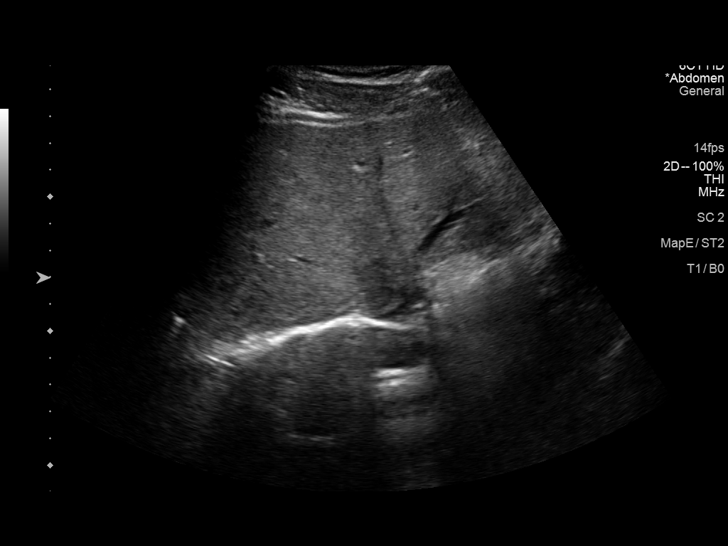
[im 37/55]
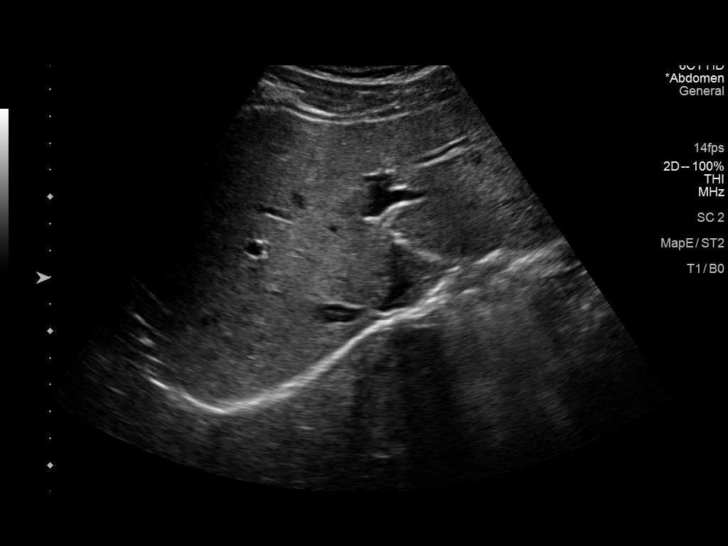
[im 41/55]
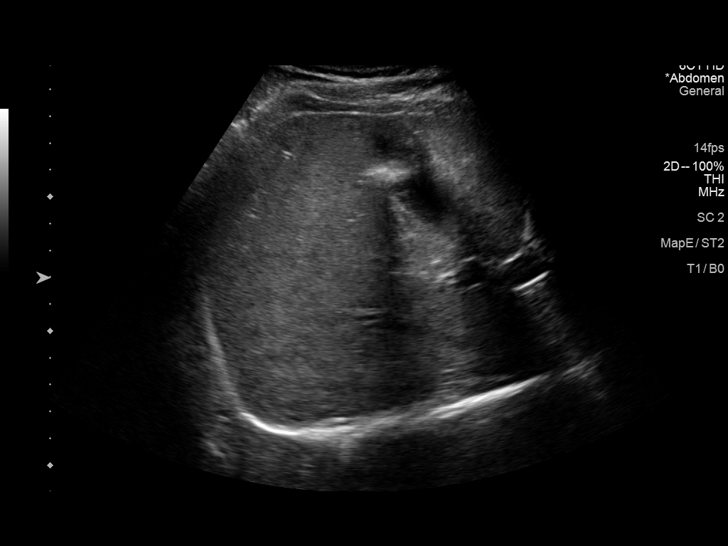
[im 46/55]
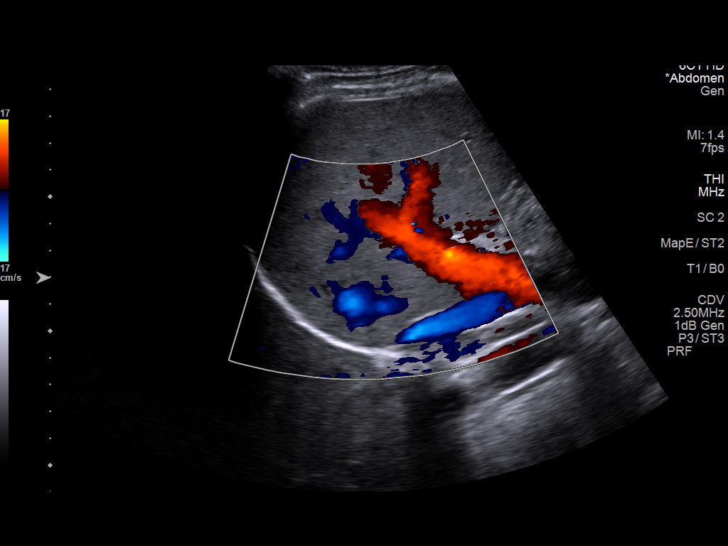
[im 50/55]
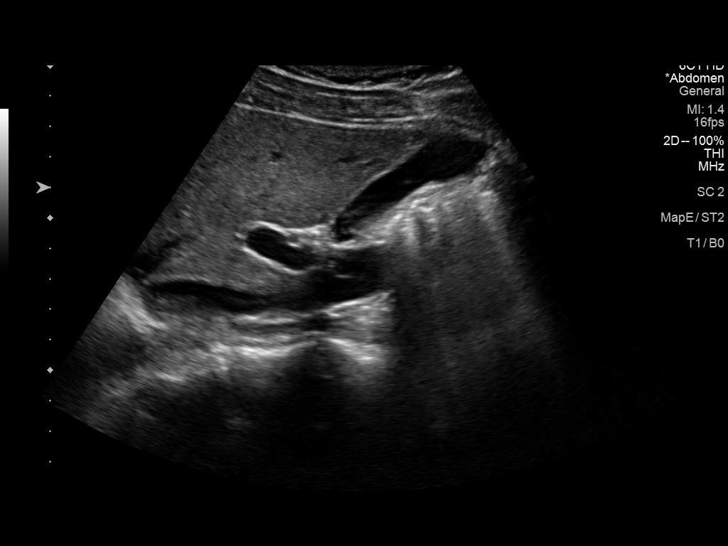
[im 55/55]
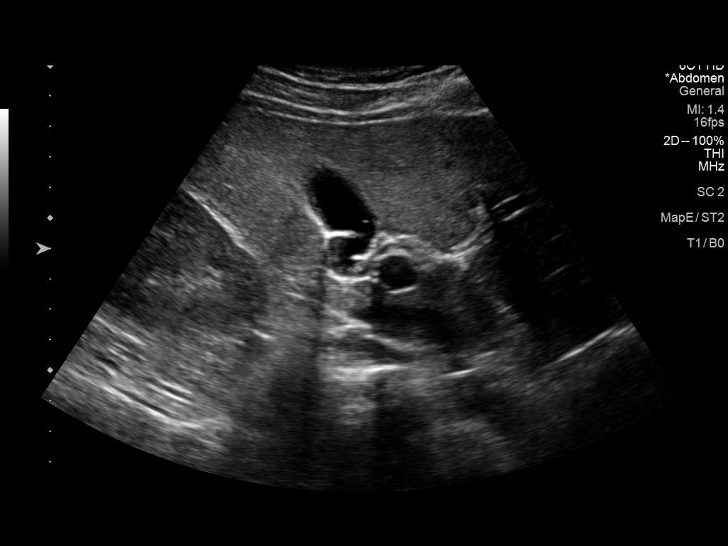

[14 of 25 positions shown; findings below may reference images not displayed]

FINDINGS: Gallbladder:

Small amount of sludge.  No shadowing stone.

Common bile duct:

Diameter: 3.8 mm

Liver:

No focal lesion identified. Within normal limits in parenchymal
echogenicity. Portal vein is patent on color Doppler imaging with
normal direction of blood flow towards the liver.

Other: None.
IMPRESSION: Small amount of gallbladder sludge.  Otherwise negative
